# Patient Record
Sex: Male | Born: 1990 | Race: White | Hispanic: No | Marital: Single | Smoking: Never smoker
Health system: Southern US, Community
[De-identification: ages and names within clinical notes are randomized; demographics above are authoritative.]

## PROBLEM LIST (undated history)

## (undated) DIAGNOSIS — K5090 Crohn's disease, unspecified, without complications: Secondary | ICD-10-CM

## (undated) DIAGNOSIS — M92219 Osteochondrosis (juvenile) of carpal lunate [Kienbock], unspecified hand: Secondary | ICD-10-CM

## (undated) HISTORY — PX: WRIST SURGERY: SHX841

## (undated) HISTORY — PX: ABDOMINAL SURGERY: SHX537

---

## 2020-04-18 ENCOUNTER — Emergency Department (HOSPITAL_BASED_OUTPATIENT_CLINIC_OR_DEPARTMENT_OTHER): Payer: Self-pay

## 2020-04-18 ENCOUNTER — Encounter (HOSPITAL_BASED_OUTPATIENT_CLINIC_OR_DEPARTMENT_OTHER): Payer: Self-pay

## 2020-04-18 ENCOUNTER — Other Ambulatory Visit: Payer: Self-pay

## 2020-04-18 ENCOUNTER — Emergency Department (HOSPITAL_BASED_OUTPATIENT_CLINIC_OR_DEPARTMENT_OTHER)
Admission: EM | Admit: 2020-04-18 | Discharge: 2020-04-18 | Disposition: A | Discharge status: Home / Self Care | Payer: Self-pay | Attending: Emergency Medicine | Admitting: Emergency Medicine

## 2020-04-18 DIAGNOSIS — Z8719 Personal history of other diseases of the digestive system: Secondary | ICD-10-CM | POA: Insufficient documentation

## 2020-04-18 DIAGNOSIS — K529 Noninfective gastroenteritis and colitis, unspecified: Secondary | ICD-10-CM | POA: Insufficient documentation

## 2020-04-18 DIAGNOSIS — K5010 Crohn's disease of large intestine without complications: Secondary | ICD-10-CM

## 2020-04-18 HISTORY — DX: Osteochondrosis (juvenile) of carpal lunate (kienbock), unspecified hand: M92.219

## 2020-04-18 HISTORY — DX: Crohn's disease, unspecified, without complications: K50.90

## 2020-04-18 LAB — COMPREHENSIVE METABOLIC PANEL
ALT: 17 U/L (ref 0–44)
AST: 28 U/L (ref 15–41)
Albumin: 4.3 g/dL (ref 3.5–5.0)
Alkaline Phosphatase: 65 U/L (ref 38–126)
Anion gap: 11 (ref 5–15)
BUN: 15 mg/dL (ref 6–20)
CO2: 27 mmol/L (ref 22–32)
Calcium: 8.8 mg/dL — ABNORMAL LOW (ref 8.9–10.3)
Chloride: 103 mmol/L (ref 98–111)
Creatinine, Ser: 1.11 mg/dL (ref 0.61–1.24)
GFR calc Af Amer: >60 mL/min (ref >60)
GFR calc non Af Amer: >60 mL/min (ref >60)
Glucose, Bld: 89 mg/dL (ref 70–99)
Potassium: 3.4 mmol/L — ABNORMAL LOW (ref 3.5–5.1)
Sodium: 141 mmol/L (ref 135–145)
Total Bilirubin: 0.5 mg/dL (ref 0.3–1.2)
Total Protein: 7.2 g/dL (ref 6.5–8.1)

## 2020-04-18 LAB — CBC WITH DIFFERENTIAL/PLATELET
Abs Immature Granulocytes: 0.03 10*3/uL (ref 0.00–0.07)
Basophils Absolute: 0.1 10*3/uL (ref 0.0–0.1)
Basophils Relative: 1 %
Eosinophils Absolute: 0.2 10*3/uL (ref 0.0–0.5)
Eosinophils Relative: 2 %
HCT: 38.2 % — ABNORMAL LOW (ref 39.0–52.0)
Hemoglobin: 12.6 g/dL — ABNORMAL LOW (ref 13.0–17.0)
Immature Granulocytes: 0 %
Lymphocytes Relative: 13 %
Lymphs Abs: 1.2 10*3/uL (ref 0.7–4.0)
MCH: 29.9 pg (ref 26.0–34.0)
MCHC: 33 g/dL (ref 30.0–36.0)
MCV: 90.7 fL (ref 80.0–100.0)
Monocytes Absolute: 0.8 10*3/uL (ref 0.1–1.0)
Monocytes Relative: 8 %
Neutro Abs: 7.4 10*3/uL (ref 1.7–7.7)
Neutrophils Relative %: 76 %
Platelets: 292 10*3/uL (ref 150–400)
RBC: 4.21 MIL/uL — ABNORMAL LOW (ref 4.22–5.81)
RDW: 12.9 % (ref 11.5–15.5)
WBC: 9.7 10*3/uL (ref 4.0–10.5)
nRBC: 0 % (ref 0.0–0.2)

## 2020-04-18 LAB — URINALYSIS, ROUTINE W REFLEX MICROSCOPIC
Bilirubin Urine: NEGATIVE
Glucose, UA: NEGATIVE mg/dL
Hgb urine dipstick: NEGATIVE
Ketones, ur: NEGATIVE mg/dL
Leukocytes,Ua: NEGATIVE
Nitrite: NEGATIVE
Protein, ur: NEGATIVE mg/dL
Specific Gravity, Urine: 1.02 (ref 1.005–1.030)
pH: 7 (ref 5.0–8.0)

## 2020-04-18 LAB — OCCULT BLOOD X 1 CARD TO LAB, STOOL: Fecal Occult Bld: NEGATIVE

## 2020-04-18 LAB — LIPASE, BLOOD: Lipase: 34 U/L (ref 11–51)

## 2020-04-18 MED ORDER — MORPHINE SULFATE (PF) 4 MG/ML IV SOLN
4.0000 mg | Freq: Once | INTRAVENOUS | Status: AC
  Administered 2020-04-18: 4 mg via INTRAVENOUS
  Filled 2020-04-18: qty 1

## 2020-04-18 MED ORDER — PREDNISONE 20 MG PO TABS
40.0000 mg | ORAL_TABLET | Freq: Once | ORAL | Status: AC
  Administered 2020-04-18: 40 mg via ORAL
  Filled 2020-04-18: qty 2

## 2020-04-18 MED ORDER — IOHEXOL 300 MG/ML  SOLN
100.0000 mL | Freq: Once | INTRAMUSCULAR | Status: AC
  Administered 2020-04-18: 100 mL via INTRAVENOUS

## 2020-04-18 MED ORDER — ONDANSETRON HCL 4 MG/2ML IJ SOLN
4.0000 mg | Freq: Once | INTRAMUSCULAR | Status: AC
  Administered 2020-04-18: 4 mg via INTRAVENOUS
  Filled 2020-04-18: qty 2

## 2020-04-18 MED ORDER — PREDNISONE 10 MG PO TABS
ORAL_TABLET | ORAL | 0 refills | Status: DC
Start: 2020-04-18 — End: 2020-08-20

## 2020-04-18 MED ORDER — SODIUM CHLORIDE 0.9 % IV BOLUS
1000.0000 mL | Freq: Once | INTRAVENOUS | Status: AC
  Administered 2020-04-18: 1000 mL via INTRAVENOUS

## 2020-04-18 NOTE — Discharge Instructions (Signed)
You have been diagnosed with colitis from Crohn's disease.  Take steroid as prescribed.  Call and follow-up closely with Dr. Chales Abrahams for further management of your condition in the next 2 to 3 weeks.

## 2020-04-18 NOTE — ED Provider Notes (Signed)
[facility] EMERGENCY DEPARTMENT Provider Note   CSN: [id number] Arrival date & time: 04/18/20  1632     History Chief Complaint  Patient presents with  . Abdominal Pain    Raymond Huerta is a 29 y.o. male.  The history is provided by the patient and medical records. No language interpreter was used.  Abdominal Pain    29 year old male with history of Crohn's disease presenting complaining of abdominal pain.  Patient developed gradual onset of pain to his left lower quadrant that has now become more diffuse since yesterday.  Pain is sharp, persistent, nothing seem to make it better or worse.  Patient report he is afraid of eating thinking it may increase the pain.  Today he also noted some bright red blood per rectum.  Blood is minimal.  He did feel a bit lightheadedness earlier this morning.  He also report having weight loss of approximately 10 to 15 pounds within the past month.  He denies having fever, chills, chest pain, shortness of breath, productive cough, dysuria.  He endorsed some nausea without vomiting.  He is currently on Stelara for his Crohn's disease.  He is not up-to-date with COVID-19 vaccination.  He denies any specific treatment tried for his symptoms aside from Advil and Bentyl.  Past Medical History:  Diagnosis Date  . Crohn disease (HCC)   . Kienbock's disease     There are no problems to display for this patient.   The histories are not reviewed yet. Please review them in the "History" navigator section and refresh this SmartLink.     No family history on file.  Social History   Tobacco Use  . Smoking status: Never Smoker  . Smokeless tobacco: Never Used  Substance Use Topics  . Alcohol use: Never  . Drug use: Never    Home Medications Prior to Admission medications   Medication Sig Start Date End Date Taking? Authorizing Provider  ustekinumab (STELARA) 90 MG/ML SOSY injection Inject into the skin. 03/14/20  Yes [provider]  dicyclomine (BENTYL) 20 MG tablet Take 20 mg by mouth 2 (two) times daily. 12/25/19   [provider]  dronabinol (MARINOL) 10 MG capsule TAKE 1 CAPSULE BY MOUTH TWICE A DAY BEFORE MEALS 03/25/20   [provider]  metoprolol tartrate (LOPRESSOR) 25 MG tablet Take 25 mg by mouth 2 (two) times daily. 04/07/20   [provider]  ondansetron (ZOFRAN-ODT) 4 MG disintegrating tablet Take 4 mg by mouth every 8 (eight) hours as needed. 11/24/19   [provider]  VYVANSE 60 MG capsule Take 60 mg by mouth every morning. 01/29/20   [provider]    Allergies    Patient has no known allergies.  Review of Systems   Review of Systems  Gastrointestinal: Positive for abdominal pain.  All other systems reviewed and are negative.   Physical Exam Updated Vital Signs BP (!) 150/96 (BP Location: Right Arm)   Pulse 76   Temp 98.5 F (36.9 C) (Oral)   Resp 16   Ht 6' (1.829 m)   Wt 68 kg   SpO2 100%   BMI 20.34 kg/m   Physical Exam Vitals and nursing note reviewed. Exam conducted with a chaperone present.  Constitutional:      General: He is not in acute distress.    Appearance: He is well-developed.  HENT:     Head: Atraumatic.  Eyes:     Conjunctiva/sclera: Conjunctivae normal.  Cardiovascular:  Rate and Rhythm: Normal rate and regular rhythm.  Pulmonary:     Effort: Pulmonary effort is normal.     Breath sounds: Normal breath sounds.  Abdominal:     General: Abdomen is flat.     Palpations: Abdomen is soft.     Tenderness: There is abdominal tenderness in the periumbilical area and left lower quadrant. There is guarding. There is no rebound. Negative signs include Murphy's sign, Rovsing's sign and McBurney's sign.  Genitourinary:    Rectum: Normal. No tenderness.     Comments: Chaperone present during exam.  Normal perirectal region without any thrombosed hemorrhoid or anal fissure.  Mild discomfort with digital rectal exam.   No obvious mass, normal prostate, no gross blood noted.  Normal rectal tone Musculoskeletal:     Cervical back: Neck supple.  Skin:    Findings: No rash.  Neurological:     Mental Status: He is alert.     ED Results / Procedures / Treatments   Labs (all labs ordered are listed, but only abnormal results are displayed) Labs Reviewed  CBC WITH DIFFERENTIAL/PLATELET - Abnormal; Notable for the following components:      Result Value   RBC 4.21 (*)    Hemoglobin 12.6 (*)    HCT 38.2 (*)    All other components within normal limits  COMPREHENSIVE METABOLIC PANEL - Abnormal; Notable for the following components:   Potassium 3.4 (*)    Calcium 8.8 (*)    All other components within normal limits  GASTROINTESTINAL PANEL BY PCR, STOOL (REPLACES STOOL CULTURE)  C DIFFICILE QUICK SCREEN W PCR REFLEX  CALPROTECTIN, FECAL  LIPASE, BLOOD  URINALYSIS, ROUTINE W REFLEX MICROSCOPIC  OCCULT BLOOD X 1 CARD TO LAB, STOOL  C-REACTIVE PROTEIN    EKG None  Radiology CT ABDOMEN PELVIS W CONTRAST  Result Date: 04/18/2020 CLINICAL DATA:  Left lower quadrant pain. Bright red rectal bleeding for 1 day. History of Crohn's. EXAM: CT ABDOMEN AND PELVIS WITH CONTRAST TECHNIQUE: Multidetector CT imaging of the abdomen and pelvis was performed using the standard protocol following bolus administration of intravenous contrast. CONTRAST:  150mL OMNIPAQUE IOHEXOL 300 MG/ML  SOLN COMPARISON:  None available. FINDINGS: Lower chest: Lung bases are clear. Hepatobiliary: Focal fatty infiltration adjacent to the falciform ligament. Postcholecystectomy without biliary dilatation. Mild periportal edema which may be related to hydration status. Portal vein is patent. Pancreas: No ductal dilatation or inflammation. Spleen: Normal in size without focal abnormality. Adrenals/Urinary Tract: Normal adrenal glands. No hydronephrosis or perinephric edema. Homogeneous renal enhancement. Urinary bladder is distended without wall  thickening. Stomach/Bowel: The stomach is unremarkable. Administered enteric contrast reaches the mid small bowel. There are few fluid-filled loops of small bowel in the pelvis but no evidence of small bowel inflammation or bowel wall thickening. Surgical clips at the base of the cecum suggestive of prior appendectomy and possible ileocectomy. There is mild colonic wall thickening, intermittently from the distal transverse colon, but more prominently involving the distal descending and proximal sigmoid colon. Mild adjacent pericolonic edema involving the descending and sigmoid colon. No significant hyperemia. No perforation. Vascular/Lymphatic: Normal caliber abdominal aorta. The portal vein is patent. The mesenteric vessels are patent. No acute vascular findings. No enlarged lymph nodes in the abdomen or pelvis. Reproductive: Prostate is unremarkable. Other: No ascites. No free air. No body wall hernia. No intra-abdominal abscess. Musculoskeletal: There are no acute or suspicious osseous abnormalities. IMPRESSION: Mild colonic wall thickening, intermittently from the distal transverse colon, but more prominently involving  the distal descending and proximal sigmoid colon. Mild adjacent pericolonic edema. Findings consistent with colitis, likely related to Crohn's disease. No obstruction, perforation or abscess. Electronically Signed   By: Narda Rutherford M.D.   On: 04/18/2020 18:36    Procedures Procedures (including critical care time)  Medications Ordered in ED Medications  predniSONE (DELTASONE) tablet 40 mg (has no administration in time range)  morphine 4 MG/ML injection 4 mg (4 mg Intravenous Given 04/18/20 1711)  ondansetron (ZOFRAN) injection 4 mg (4 mg Intravenous Given 04/18/20 1711)  sodium chloride 0.9 % bolus 1,000 mL (0 mLs Intravenous Stopped 04/18/20 1922)  iohexol (OMNIPAQUE) 300 MG/ML solution 100 mL (100 mLs Intravenous Contrast Given 04/18/20 1813)  morphine 4 MG/ML injection 4 mg (4 mg  Intravenous Given 04/18/20 1809)  morphine 4 MG/ML injection 4 mg (4 mg Intravenous Given 04/18/20 1917)  ondansetron (ZOFRAN) injection 4 mg (4 mg Intravenous Given 04/18/20 1918)    ED Course  I have reviewed the triage vital signs and the nursing notes.  Pertinent labs & imaging results that were available during my care of the patient were reviewed by me and considered in my medical decision making (see chart for details).    MDM Rules/Calculators/A&P                          BP (!) 150/96 (BP Location: Right Arm)   Pulse 76   Temp 98.5 F (36.9 C) (Oral)   Resp 16   Ht 6' (1.829 m)   Wt 68 kg   SpO2 100%   BMI 20.34 kg/m   Final Clinical Impression(s) / ED Diagnoses Final diagnoses:  None    Rx / DC Orders ED Discharge Orders    None     Patient with history of Crohn's disease currently on Stelara presenting with abdominal pain for the past 2 to 3 days.  His labs are reassuring.  CT scan demonstrates finding consistent with colitis without obstruction perforation or abscess.  9:03 PM Appreciate consultation from on-call GI specialist, Dr. Chales Abrahams, who recommend obtaining a CRP, a GI panel stool collection including C. difficile, as well as a fecal calprotectin.  He also recommend starting patient on a steroid taper dose including 40 mg p.o. x2 weeks, 30 mg p.o. x1 week, 20 mg p.o. x1 week and 10 mg p.o. x1 week.  Patient should continue with Stelara and f/u in office in the next 2-3 weeks.  Pt voice understanding and agrees with plan.      Fayrene Helper, PA-C 04/18/20 2144    Benjiman Core, MD 04/19/20 [phone]

## 2020-04-18 NOTE — ED Triage Notes (Signed)
Pt arrives with c/o bright red rectal bleeding X1 day

## 2020-04-18 NOTE — ED Notes (Signed)
[facility] notified (Tara) - patient ready for transport 

## 2020-04-18 NOTE — ED Notes (Signed)
Pt states was unable to get stool sample

## 2020-04-19 LAB — C-REACTIVE PROTEIN: CRP: 0.5 mg/dL (ref <1.0)

## 2020-04-23 ENCOUNTER — Emergency Department (HOSPITAL_BASED_OUTPATIENT_CLINIC_OR_DEPARTMENT_OTHER): Payer: Self-pay

## 2020-04-23 ENCOUNTER — Encounter (HOSPITAL_BASED_OUTPATIENT_CLINIC_OR_DEPARTMENT_OTHER): Payer: Self-pay

## 2020-04-23 ENCOUNTER — Other Ambulatory Visit: Payer: Self-pay

## 2020-04-23 ENCOUNTER — Emergency Department (HOSPITAL_BASED_OUTPATIENT_CLINIC_OR_DEPARTMENT_OTHER)
Admission: EM | Admit: 2020-04-23 | Discharge: 2020-04-23 | Disposition: A | Discharge status: Home / Self Care | Payer: Self-pay | Attending: Emergency Medicine | Admitting: Emergency Medicine

## 2020-04-23 DIAGNOSIS — K5900 Constipation, unspecified: Secondary | ICD-10-CM | POA: Insufficient documentation

## 2020-04-23 DIAGNOSIS — R112 Nausea with vomiting, unspecified: Secondary | ICD-10-CM | POA: Insufficient documentation

## 2020-04-23 LAB — CBC WITH DIFFERENTIAL/PLATELET
Abs Immature Granulocytes: 0.05 10*3/uL (ref 0.00–0.07)
Basophils Absolute: 0 10*3/uL (ref 0.0–0.1)
Basophils Relative: 0 %
Eosinophils Absolute: 0.1 10*3/uL (ref 0.0–0.5)
Eosinophils Relative: 1 %
HCT: 34.7 % — ABNORMAL LOW (ref 39.0–52.0)
Hemoglobin: 11.4 g/dL — ABNORMAL LOW (ref 13.0–17.0)
Immature Granulocytes: 0 %
Lymphocytes Relative: 15 %
Lymphs Abs: 1.9 10*3/uL (ref 0.7–4.0)
MCH: 29.7 pg (ref 26.0–34.0)
MCHC: 32.9 g/dL (ref 30.0–36.0)
MCV: 90.4 fL (ref 80.0–100.0)
Monocytes Absolute: 1.2 10*3/uL — ABNORMAL HIGH (ref 0.1–1.0)
Monocytes Relative: 10 %
Neutro Abs: 9.4 10*3/uL — ABNORMAL HIGH (ref 1.7–7.7)
Neutrophils Relative %: 74 %
Platelets: 299 10*3/uL (ref 150–400)
RBC: 3.84 MIL/uL — ABNORMAL LOW (ref 4.22–5.81)
RDW: 13 % (ref 11.5–15.5)
WBC: 12.8 10*3/uL — ABNORMAL HIGH (ref 4.0–10.5)
nRBC: 0 % (ref 0.0–0.2)

## 2020-04-23 LAB — COMPREHENSIVE METABOLIC PANEL
ALT: 19 U/L (ref 0–44)
AST: 20 U/L (ref 15–41)
Albumin: 4.1 g/dL (ref 3.5–5.0)
Alkaline Phosphatase: 53 U/L (ref 38–126)
Anion gap: 10 (ref 5–15)
BUN: 9 mg/dL (ref 6–20)
CO2: 25 mmol/L (ref 22–32)
Calcium: 8.4 mg/dL — ABNORMAL LOW (ref 8.9–10.3)
Chloride: 104 mmol/L (ref 98–111)
Creatinine, Ser: 0.99 mg/dL (ref 0.61–1.24)
GFR calc Af Amer: >60 mL/min (ref >60)
GFR calc non Af Amer: >60 mL/min (ref >60)
Glucose, Bld: 97 mg/dL (ref 70–99)
Potassium: 2.9 mmol/L — ABNORMAL LOW (ref 3.5–5.1)
Sodium: 139 mmol/L (ref 135–145)
Total Bilirubin: 0.9 mg/dL (ref 0.3–1.2)
Total Protein: 6.8 g/dL (ref 6.5–8.1)

## 2020-04-23 LAB — LIPASE, BLOOD: Lipase: 24 U/L (ref 11–51)

## 2020-04-23 MED ORDER — IOHEXOL 300 MG/ML  SOLN
100.0000 mL | Freq: Once | INTRAMUSCULAR | Status: AC | PRN
  Administered 2020-04-23: 100 mL via INTRAVENOUS

## 2020-04-23 MED ORDER — ONDANSETRON HCL 4 MG/2ML IJ SOLN
4.0000 mg | Freq: Once | INTRAMUSCULAR | Status: AC
  Administered 2020-04-23: 4 mg via INTRAVENOUS
  Filled 2020-04-23: qty 2

## 2020-04-23 MED ORDER — POTASSIUM CHLORIDE 10 MEQ/100ML IV SOLN
10.0000 meq | INTRAVENOUS | Status: AC
  Administered 2020-04-23 (×2): 10 meq via INTRAVENOUS
  Filled 2020-04-23 (×2): qty 100

## 2020-04-23 MED ORDER — DICYCLOMINE HCL 10 MG/ML IM SOLN
20.0000 mg | Freq: Once | INTRAMUSCULAR | Status: AC
  Administered 2020-04-23: 20 mg via INTRAMUSCULAR
  Filled 2020-04-23: qty 2

## 2020-04-23 MED ORDER — FENTANYL CITRATE (PF) 100 MCG/2ML IJ SOLN
100.0000 ug | Freq: Once | INTRAMUSCULAR | Status: AC
  Administered 2020-04-23: 100 ug via INTRAVENOUS
  Filled 2020-04-23: qty 2

## 2020-04-23 MED ORDER — KETOROLAC TROMETHAMINE 30 MG/ML IJ SOLN
30.0000 mg | Freq: Once | INTRAMUSCULAR | Status: AC
  Administered 2020-04-23: 30 mg via INTRAVENOUS
  Filled 2020-04-23: qty 1

## 2020-04-23 MED ORDER — PROMETHAZINE HCL 25 MG RE SUPP
25.0000 mg | Freq: Four times a day (QID) | RECTAL | 0 refills | Status: AC | PRN
Start: 2020-04-23 — End: ?

## 2020-04-23 MED ORDER — SODIUM CHLORIDE 0.9 % IV BOLUS
1000.0000 mL | Freq: Once | INTRAVENOUS | Status: AC
  Administered 2020-04-23: 1000 mL via INTRAVENOUS

## 2020-04-23 MED ORDER — MORPHINE SULFATE (PF) 4 MG/ML IV SOLN
4.0000 mg | Freq: Once | INTRAVENOUS | Status: AC
  Administered 2020-04-23: 4 mg via INTRAVENOUS
  Filled 2020-04-23: qty 1

## 2020-04-23 NOTE — ED Notes (Signed)
ED Provider at bedside. 

## 2020-04-23 NOTE — Discharge Instructions (Addendum)
Thank you for seeing [place] today!  Continue your steroids as prescribed.  Recommend miralax for your constipation, you may try one cap but if you have continued severe constipation recommend 4 caps in one 32oz bottle of gatorade (or similar.) Follow up with your gastroenterologist (current or GI physician of your choosing.)

## 2020-04-23 NOTE — ED Triage Notes (Signed)
Pt with hx crohns disease past 12 years, hx resection.  Arrives with increased pain and nausea, vomiting.  Taking ibuprofen, zofran bentyl prior to arrival.

## 2020-04-23 NOTE — ED Notes (Signed)
Pt given Oral contrast to drink; quick drink, scan in 1 hour; discussed w/ EDMD

## 2020-04-23 NOTE — ED Provider Notes (Signed)
[facility] EMERGENCY DEPARTMENT Provider Note   CSN: [id number] Arrival date & time: 04/23/20  9528     History Chief Complaint  Patient presents with  . Abdominal Pain    Raymond Huerta is a 29 y.o. male.  HPI     Pain worsened again last night and today Nausea and vomiting, vomited twice today, no blood in emesis No black or bloody stools Constipation. Not passing gas. Well maybe a little bit very early this AM but none since then. No fevers. No urinary symptoms.   Pain periumbilical, R and LLQ, nothing makes it better or worse, tried old bentyl, tylenol, zofran, some ibuprofen but trying to avoid it. 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 Crohn disease (HCC)   . Kienbock's disease     There are no problems to display for this patient.   Past Surgical History:  Procedure Laterality Date  . ABDOMINAL SURGERY         History reviewed. No pertinent family history.  Social History   Tobacco Use  . Smoking status: Never Smoker  . Smokeless tobacco: Never Used  Substance Use Topics  . Alcohol use: Never  . Drug use: Never    Home Medications Prior to Admission medications   Medication Sig Start Date End Date Taking? Authorizing Provider  dicyclomine (BENTYL) 20 MG tablet Take 20 mg by mouth 2 (two) times daily. 12/25/19   [provider]  dronabinol (MARINOL) 10 MG capsule TAKE 1 CAPSULE BY MOUTH TWICE A DAY BEFORE MEALS 03/25/20   [provider]  metoprolol tartrate (LOPRESSOR) 25 MG tablet Take 25 mg by mouth 2 (two) times daily. 04/07/20   [provider]  ondansetron (ZOFRAN-ODT) 4 MG disintegrating tablet Take 4 mg by mouth every 8 (eight) hours as needed. 11/24/19   [provider]  predniSONE (DELTASONE) 10 MG tablet Take 40mg  PO daily x 2 weeks, 30mg  PO daily x 1  week, 20mg  PO daily x 1 week, and 10mg  PO daily x 1 week 04/18/20   , PA-C  promethazine (PHENERGAN) 25 MG suppository Place 1 suppository (25 mg total) rectally every 6 (six) hours as needed for nausea or vomiting. 04/23/20   , MD  ustekinumab (STELARA) 90 MG/ML SOSY injection Inject into the skin. 03/14/20   [provider]  VYVANSE 60 MG capsule Take 60 mg by mouth every morning. 01/29/20   [provider]    Allergies    Patient has no known allergies.  Review of Systems   Review of Systems  Constitutional: Negative for fever.  HENT: Negative for sore throat.   Eyes: Negative for visual disturbance.  Respiratory: Negative for shortness of breath.   Cardiovascular: Negative for chest pain.  Gastrointestinal: Positive for abdominal pain, constipation, nausea and vomiting.  Genitourinary: Negative for difficulty urinating.  Musculoskeletal: Negative for back pain and neck stiffness.  Skin: Negative for rash.  Neurological: Negative for syncope and headaches.    Physical Exam Updated Vital Signs BP 135/90   Pulse 64   Temp 98.4 F (36.9 C) (Oral)   Resp 20   SpO2 100%   Physical Exam Vitals and nursing note reviewed.  Constitutional:      General: He is not in acute distress.    Appearance: He is well-developed. He is not  diaphoretic.  HENT:     Head: Normocephalic and atraumatic.  Eyes:     Conjunctiva/sclera: Conjunctivae normal.  Cardiovascular:     Rate and Rhythm: Normal rate and regular rhythm.     Heart sounds: Normal heart sounds. No murmur heard.  No friction rub. No gallop.   Pulmonary:     Effort: Pulmonary effort is normal. No respiratory distress.     Breath sounds: Normal breath sounds. No wheezing or rales.  Abdominal:     General: There is no distension.     Palpations: Abdomen is soft.     Tenderness: There is generalized abdominal tenderness. There is no guarding.  Musculoskeletal:     Cervical back:  Normal range of motion.  Skin:    General: Skin is warm and dry.  Neurological:     Mental Status: He is alert and oriented to person, place, and time.     ED Results / Procedures / Treatments   Labs (all labs ordered are listed, but only abnormal results are displayed) Labs Reviewed  CBC WITH DIFFERENTIAL/PLATELET - Abnormal; Notable for the following components:      Result Value   WBC 12.8 (*)    RBC 3.84 (*)    Hemoglobin 11.4 (*)    HCT 34.7 (*)    Neutro Abs 9.4 (*)    Monocytes Absolute 1.2 (*)    All other components within normal limits  COMPREHENSIVE METABOLIC PANEL - Abnormal; Notable for the following components:   Potassium 2.9 (*)    Calcium 8.4 (*)    All other components within normal limits  LIPASE, BLOOD    EKG None  Radiology CT ABDOMEN PELVIS W CONTRAST  Result Date: 04/23/2020 CLINICAL DATA:  Abdominal pain, vomiting, diarrhea. History of Crohn's disease. EXAM: CT ABDOMEN AND PELVIS WITH CONTRAST TECHNIQUE: Multidetector CT imaging of the abdomen and pelvis was performed using the standard protocol following bolus administration of intravenous contrast. CONTRAST:  OMNIPAQUE IOHEXOL 300 MG/ML  SOLN COMPARISON:  04/18/2020 FINDINGS: Lower chest: No acute abnormality. Hepatobiliary: Previously seen periportal edema has improved. No focal liver abnormality is seen. Status post cholecystectomy. No biliary dilatation. Pancreas: Unremarkable. No pancreatic ductal dilatation or surrounding inflammatory changes. Spleen: Normal in size without focal abnormality. Adrenals/Urinary Tract: Unremarkable adrenal glands. Kidneys enhance symmetrically without focal lesion, stone, or hydronephrosis. Ureters are nondilated. Urinary bladder is largely decompressed but appears grossly unremarkable. Stomach/Bowel: Stomach is unremarkable. There is enteric contrast within the proximal to mid small bowel. Postsurgical changes in the right lower quadrant likely reflecting  appendectomy with probable ileocecectomy. No focal colonic thickening, improved from prior. Mild-to-moderate volume of stool throughout the colon. No evidence of small-bowel thickening. No bowel obstruction. Vascular/Lymphatic: No significant vascular findings are present. No enlarged abdominal or pelvic lymph nodes. Reproductive: Prostate is unremarkable. Other: No free air. No free fluid. No abdominopelvic fluid collection or abscess. No abdominal wall hernia. Musculoskeletal: No acute or significant osseous findings. IMPRESSION: 1. No acute abdominopelvic findings. 2. Interval resolution of previously seen colonic wall thickening. 3. Mild-to-moderate volume of stool throughout the colon. Electronically Signed   By: Duanne Guess D.O.   On: 04/23/2020 12:16    Procedures Procedures (including critical care time)  Medications Ordered in ED Medications  sodium chloride 0.9 % bolus 1,000 mL (0 mLs Intravenous Stopped 04/23/20 1149)  ondansetron (ZOFRAN) injection 4 mg (4 mg Intravenous Given 04/23/20 0933)  morphine 4 MG/ML injection 4 mg (4 mg Intravenous Given 04/23/20 0932)  dicyclomine (  BENTYL) injection 20 mg (20 mg Intramuscular Given 04/23/20 1023)  potassium chloride 10 mEq in 100 mL IVPB (0 mEq Intravenous Stopped 04/23/20 1253)  fentaNYL (SUBLIMAZE) injection 100 mcg (100 mcg Intravenous Given 04/23/20 1018)  iohexol (OMNIPAQUE) 300 MG/ML solution 100 mL (100 mLs Intravenous Contrast Given 04/23/20 1124)  ondansetron (ZOFRAN) injection 4 mg (4 mg Intravenous Given 04/23/20 1315)  ketorolac (TORADOL) 30 MG/ML injection 30 mg (30 mg Intravenous Given 04/23/20 1315)    ED Course  I have reviewed the triage vital signs and the nursing notes.  Pertinent labs & imaging results that were available during my care of the patient were reviewed by me and considered in my medical decision making (see chart for details).    MDM Rules/Calculators/A&P                          29yo male with history  of Crohn's disease with recent visit 6/14 with finding of colitis started on steroid taper, had also been seen 6/8 in two different emergency departments who presents with concern for worsening abdominal pain, nausea and vomiting and constipation.  Describing symptoms of worsening pain, inability to tolerate po, no flatus, and although he had CT 5 days ago, will repeat to look for signs of obstruction.  Labs without signs of pancreatitis or hepatitis. CT shows no sign of obstruction and resolution of previously seen colonic wall thickening with moderate volume stool.  Suspect symptoms may be secondary to constipation.  Recommend miralax. Given rx for phenergan suppositories for nausea. Patient discharged in stable condition with understanding of reasons to return.   Final Clinical Impression(s) / ED Diagnoses Final diagnoses:  Non-intractable vomiting with nausea, unspecified vomiting type  Constipation, unspecified constipation type    Rx / DC Orders ED Discharge Orders         Ordered    promethazine (PHENERGAN) 25 MG suppository  Every 6 hours PRN     Discontinue  Reprint     04/23/20 1302           Gareth Morgan, MD 04/23/20 1841

## 2020-08-20 ENCOUNTER — Emergency Department (HOSPITAL_BASED_OUTPATIENT_CLINIC_OR_DEPARTMENT_OTHER)
Admission: EM | Admit: 2020-08-20 | Discharge: 2020-08-20 | Disposition: A | Discharge status: Home / Self Care | Payer: Self-pay | Attending: Emergency Medicine | Admitting: Emergency Medicine

## 2020-08-20 ENCOUNTER — Other Ambulatory Visit: Payer: Self-pay

## 2020-08-20 ENCOUNTER — Emergency Department (HOSPITAL_BASED_OUTPATIENT_CLINIC_OR_DEPARTMENT_OTHER): Payer: Self-pay

## 2020-08-20 ENCOUNTER — Encounter (HOSPITAL_BASED_OUTPATIENT_CLINIC_OR_DEPARTMENT_OTHER): Payer: Self-pay | Admitting: Emergency Medicine

## 2020-08-20 DIAGNOSIS — Z9889 Other specified postprocedural states: Secondary | ICD-10-CM | POA: Insufficient documentation

## 2020-08-20 DIAGNOSIS — K5090 Crohn's disease, unspecified, without complications: Secondary | ICD-10-CM | POA: Insufficient documentation

## 2020-08-20 DIAGNOSIS — K529 Noninfective gastroenteritis and colitis, unspecified: Secondary | ICD-10-CM | POA: Insufficient documentation

## 2020-08-20 LAB — HEPATIC FUNCTION PANEL
ALT: 15 U/L (ref 0–44)
AST: 23 U/L (ref 15–41)
Albumin: 4.5 g/dL (ref 3.5–5.0)
Alkaline Phosphatase: 79 U/L (ref 38–126)
Bilirubin, Direct: 0.2 mg/dL (ref 0.0–0.2)
Indirect Bilirubin: 0.8 mg/dL (ref 0.3–0.9)
Total Bilirubin: 1 mg/dL (ref 0.3–1.2)
Total Protein: 7.9 g/dL (ref 6.5–8.1)

## 2020-08-20 LAB — CBC WITH DIFFERENTIAL/PLATELET
Abs Immature Granulocytes: 0.02 10*3/uL (ref 0.00–0.07)
Basophils Absolute: 0.1 10*3/uL (ref 0.0–0.1)
Basophils Relative: 2 %
Eosinophils Absolute: 0.1 10*3/uL (ref 0.0–0.5)
Eosinophils Relative: 1 %
HCT: 41.3 % (ref 39.0–52.0)
Hemoglobin: 12.9 g/dL — ABNORMAL LOW (ref 13.0–17.0)
Immature Granulocytes: 0 %
Lymphocytes Relative: 18 %
Lymphs Abs: 1.2 10*3/uL (ref 0.7–4.0)
MCH: 25.6 pg — ABNORMAL LOW (ref 26.0–34.0)
MCHC: 31.2 g/dL (ref 30.0–36.0)
MCV: 82.1 fL (ref 80.0–100.0)
Monocytes Absolute: 0.8 10*3/uL (ref 0.1–1.0)
Monocytes Relative: 11 %
Neutro Abs: 4.7 10*3/uL (ref 1.7–7.7)
Neutrophils Relative %: 68 %
Platelets: 315 10*3/uL (ref 150–400)
RBC: 5.03 MIL/uL (ref 4.22–5.81)
RDW: 15.4 % (ref 11.5–15.5)
WBC: 6.8 10*3/uL (ref 4.0–10.5)
nRBC: 0 % (ref 0.0–0.2)

## 2020-08-20 LAB — BASIC METABOLIC PANEL
Anion gap: 9 (ref 5–15)
BUN: 21 mg/dL — ABNORMAL HIGH (ref 6–20)
CO2: 26 mmol/L (ref 22–32)
Calcium: 9.1 mg/dL (ref 8.9–10.3)
Chloride: 102 mmol/L (ref 98–111)
Creatinine, Ser: 1.12 mg/dL (ref 0.61–1.24)
GFR, Estimated: >60 mL/min (ref >60)
Glucose, Bld: 89 mg/dL (ref 70–99)
Potassium: 3 mmol/L — ABNORMAL LOW (ref 3.5–5.1)
Sodium: 137 mmol/L (ref 135–145)

## 2020-08-20 LAB — LIPASE, BLOOD: Lipase: 34 U/L (ref 11–51)

## 2020-08-20 MED ORDER — PREDNISONE 20 MG PO TABS
40.0000 mg | ORAL_TABLET | Freq: Every day | ORAL | 0 refills | Status: AC
Start: 2020-08-20 — End: 2020-08-27

## 2020-08-20 MED ORDER — SODIUM CHLORIDE 0.9 % IV BOLUS
1000.0000 mL | Freq: Once | INTRAVENOUS | Status: AC
  Administered 2020-08-20: 1000 mL via INTRAVENOUS

## 2020-08-20 MED ORDER — IOHEXOL 300 MG/ML  SOLN
100.0000 mL | Freq: Once | INTRAMUSCULAR | Status: AC | PRN
  Administered 2020-08-20: 100 mL via INTRAVENOUS

## 2020-08-20 MED ORDER — MORPHINE SULFATE (PF) 4 MG/ML IV SOLN
4.0000 mg | Freq: Once | INTRAVENOUS | Status: AC
  Administered 2020-08-20: 4 mg via INTRAVENOUS
  Filled 2020-08-20: qty 1

## 2020-08-20 MED ORDER — ONDANSETRON HCL 4 MG/2ML IJ SOLN
4.0000 mg | Freq: Once | INTRAMUSCULAR | Status: AC
  Administered 2020-08-20: 4 mg via INTRAVENOUS
  Filled 2020-08-20: qty 2

## 2020-08-20 NOTE — ED Notes (Signed)
ED Provider at bedside. 

## 2020-08-20 NOTE — ED Provider Notes (Signed)
[facility] EMERGENCY DEPARTMENT Provider Note   CSN: [id number] Arrival date & time: 08/20/20  1043     History Chief Complaint  Patient presents with  . Abdominal Pain    Raymond Huerta is a 29 y.o. male.  The history is provided by the patient.  Abdominal Pain Pain location:  Generalized Pain quality: aching   Pain radiates to:  Does not radiate Pain severity:  Mild Onset quality:  Gradual Timing:  Constant Progression:  Worsening Chronicity:  New Context comment:  Crohns with worsening symptoms, seen at another ED yesterday but feel okay not to get a CT, pain is getting worse and wants scan now. Relieved by:  Nothing Worsened by:  Nothing Associated symptoms: anorexia, diarrhea and nausea   Associated symptoms: no chest pain, no chills, no constipation, no cough, no dysuria, no fever, no hematuria, no shortness of breath, no sore throat and no vomiting   Risk factors: multiple surgeries        Past Medical History:  Diagnosis Date  . Crohn disease (HCC)   . Kienbock's disease     There are no problems to display for this patient.   Past Surgical History:  Procedure Laterality Date  . ABDOMINAL SURGERY    . WRIST SURGERY         No family history on file.  Social History   Tobacco Use  . Smoking status: Never Smoker  . Smokeless tobacco: Never Used  Substance Use Topics  . Alcohol use: Never  . Drug use: Never    Home Medications Prior to Admission medications   Medication Sig Start Date End Date Taking? Authorizing Provider  dicyclomine (BENTYL) 20 MG tablet Take 20 mg by mouth 2 (two) times daily. 12/25/19   [provider]  dronabinol (MARINOL) 10 MG capsule TAKE 1 CAPSULE BY MOUTH TWICE A DAY BEFORE MEALS 03/25/20   [provider]  metoprolol tartrate (LOPRESSOR) 25 MG tablet Take 25 mg by mouth 2 (two) times daily. 04/07/20   [provider]  ondansetron (ZOFRAN-ODT) 4 MG disintegrating tablet Take 4  mg by mouth every 8 (eight) hours as needed. 11/24/19   [provider]  predniSONE (DELTASONE) 20 MG tablet Take 2 tablets (40 mg total) by mouth daily for 7 days. 08/20/20 08/27/20  Manny Vitolo, DO  promethazine (PHENERGAN) 25 MG suppository Place 1 suppository (25 mg total) rectally every 6 (six) hours as needed for nausea or vomiting. 04/23/20   Alvira Monday, MD  ustekinumab (STELARA) 90 MG/ML SOSY injection Inject into the skin. 03/14/20   [provider]  VYVANSE 60 MG capsule Take 60 mg by mouth every morning. 01/29/20   [provider]    Allergies    Patient has no known allergies.  Review of Systems   Review of Systems  Constitutional: Negative for chills and fever.  HENT: Negative for ear pain and sore throat.   Eyes: Negative for pain and visual disturbance.  Respiratory: Negative for cough and shortness of breath.   Cardiovascular: Negative for chest pain and palpitations.  Gastrointestinal: Positive for abdominal pain, anorexia, diarrhea and nausea. Negative for constipation and vomiting.  Genitourinary: Negative for dysuria and hematuria.  Musculoskeletal: Negative for arthralgias and back pain.  Skin: Negative for color change and rash.  Neurological: Negative for seizures and syncope.  All other systems reviewed and are negative.   Physical Exam Updated Vital Signs BP (!) 163/96 (BP Location: Right Arm)   Pulse 99  Temp 98.2 F (36.8 C) (Oral)   Resp 16   Ht 5\' 11"  (1.803 m)   Wt 62.9 kg   SpO2 100%   BMI 19.33 kg/m   Physical Exam Vitals and nursing note reviewed.  Constitutional:      General: He is not in acute distress.    Appearance: He is well-developed. He is not ill-appearing.  HENT:     Head: Normocephalic and atraumatic.     Mouth/Throat:     Mouth: Mucous membranes are moist.  Eyes:     Extraocular Movements: Extraocular movements intact.     Conjunctiva/sclera: Conjunctivae normal.     Pupils: Pupils are  equal, round, and reactive to light.  Cardiovascular:     Rate and Rhythm: Normal rate and regular rhythm.     Heart sounds: Normal heart sounds. No murmur heard.   Pulmonary:     Effort: Pulmonary effort is normal. No respiratory distress.     Breath sounds: Normal breath sounds.  Abdominal:     General: Abdomen is flat.     Palpations: Abdomen is soft.     Tenderness: There is generalized abdominal tenderness. There is guarding.  Musculoskeletal:     Cervical back: Neck supple.  Skin:    General: Skin is warm and dry.     Capillary Refill: Capillary refill takes less than 2 seconds.  Neurological:     General: No focal deficit present.     Mental Status: He is alert.  Psychiatric:        Mood and Affect: Mood normal.     ED Results / Procedures / Treatments   Labs (all labs ordered are listed, but only abnormal results are displayed) Labs Reviewed  CBC WITH DIFFERENTIAL/PLATELET - Abnormal; Notable for the following components:      Result Value   Hemoglobin 12.9 (*)    MCH 25.6 (*)    All other components within normal limits  BASIC METABOLIC PANEL - Abnormal; Notable for the following components:   Potassium 3.0 (*)    BUN 21 (*)    All other components within normal limits  HEPATIC FUNCTION PANEL  LIPASE, BLOOD    EKG None  Radiology CT ABDOMEN PELVIS W CONTRAST  Result Date: 08/20/2020 CLINICAL DATA:  History of Crohn's disease with previous hemicolectomy, now with abdominal distention. EXAM: CT ABDOMEN AND PELVIS WITH CONTRAST TECHNIQUE: Multidetector CT imaging of the abdomen and pelvis was performed using the standard protocol following bolus administration of intravenous contrast. CONTRAST:  08/22/2020 OMNIPAQUE IOHEXOL 300 MG/ML  SOLN COMPARISON:  05/03/2020; 04/18/2020 FINDINGS: Lower chest: Limited visualization of the lower thorax is negative for focal airspace opacity or pleural effusion. Hepatobiliary: Normal hepatic contour. There is a minimal amount of  focal fatty infiltration adjacent to the fissure for ligamentum teres. No discrete hepatic lesions. Post cholecystectomy with similar appearing mild dilatation of the intrahepatic biliary tree, likely the sequela of biliary reservoir phenomena. No extrahepatic bili duct dilatation. No ascites. Pancreas: Normal appearance of the pancreas. Spleen: Normal appearance of the spleen. Adrenals/Urinary Tract: There is symmetric enhancement of the bilateral kidneys. No evidence of nephrolithiasis. No urine obstruction or perinephric stranding. Normal appearance the bilateral adrenal glands. Normal appearance of the urinary bladder given degree of distention. Stomach/Bowel: Apparent circumferential wall thickening involving the descending and sigmoid colon (representative images 25, 39, 45, 61 and 64, series 5), potentially accentuated due to underdistention though conceivably an enteritis could have a similar appearance. No additional areas of discrete  bowel wall thickening are identified. No evidence of perforation or definable/drainable fluid collection. No definitive evidence enteric fistula. Stable sequela of distal ileal resection without evidence of enteric obstruction. No additional discrete areas of bowel wall thickening are identified. Post appendectomy. No pneumoperitoneum, pneumatosis or portal venous gas. Vascular/Lymphatic: Normal caliber the abdominal aorta. The major branch vessels of the abdominal aorta appear patent. No bulky retroperitoneal, mesenteric, pelvic or inguinal lymphadenopathy. Reproductive: Normal appearance the prostate gland. No free fluid the pelvic cul-de-sac. Other: Regional soft tissues appear normal. Musculoskeletal: No acute or aggressive osseous abnormalities. IMPRESSION: 1. Apparent circumferential wall thickening involving the descending and sigmoid colon, potentially accentuated due to underdistention though conceivably an enteritis (such as Crohn's colitis given clinical history)  could have a similar appearance. No evidence of perforation or definable/drainable fluid collection. 2. Stable sequela of distal ileal resection without evidence of enteric obstruction. 3. Post cholecystectomy and appendectomy. Electronically Signed   By: Simonne Come M.D.   On: 08/20/2020 11:51    Procedures Procedures (including critical care time)  Medications Ordered in ED Medications  morphine 4 MG/ML injection 4 mg (has no administration in time range)  morphine 4 MG/ML injection 4 mg (4 mg Intravenous Given 08/20/20 1122)  ondansetron (ZOFRAN) injection 4 mg (4 mg Intravenous Given 08/20/20 1121)  sodium chloride 0.9 % bolus 1,000 mL (1,000 mLs Intravenous New Bag/Given 08/20/20 1125)  iohexol (OMNIPAQUE) 300 MG/ML solution 100 mL (100 mLs Intravenous Contrast Given 08/20/20 1135)    ED Course  I have reviewed the triage vital signs and the nursing notes.  Pertinent labs & imaging results that were available during my care of the patient were reviewed by me and considered in my medical decision making (see chart for details).    MDM Rules/Calculators/A&P                          Raymond Huerta is a 29 year old male with history of Crohn's who presents the ED with abdominal pain.  Unremarkable vitals.  No fever.  Worsening abdominal pain over the last several days.  Seen yesterday at a hospital but felt better after pain medications and declined a CT scan yesterday.  However pain getting worse and thinks that he needs one.  I agree as he does have tenderness throughout but worse in the right lower quadrant.  States he had his appendix removed in the past.  Suspect Crohn's flare but will rule out other infectious or acute intra-abdominal process with CT scan.  Will give IV fluids, IV nausea meds, IV morphine.  CT scan likely consistent with a Crohn's colitis.  However lab work otherwise unremarkable.  Felt better after IV pain medicine.  Will prescribe prednisone.  We will have him  follow-up with GI and his primary care doctor.  This chart was dictated using voice recognition software.  Despite best efforts to proofread,  errors can occur which can change the documentation meaning.   Final Clinical Impression(s) / ED Diagnoses Final diagnoses:  Colitis    Rx / DC Orders ED Discharge Orders         Ordered    predniSONE (DELTASONE) 20 MG tablet  Daily        08/20/20 1206           Virgina Norfolk, DO 08/20/20 1207

## 2020-08-20 NOTE — ED Triage Notes (Signed)
Episode of Crohn's fare up that started last night. Abdominal pain with some diarrhea and blood in his stool.

## 2020-08-20 NOTE — ED Notes (Signed)
Patient transported to CT 

## 2020-08-20 NOTE — Discharge Instructions (Addendum)
Follow-up with your primary care doctor or GI doctor to continue to discuss further treatment plan.  I have written for steroids for a week but they may want to extend the course.

## 2020-09-01 ENCOUNTER — Emergency Department (HOSPITAL_BASED_OUTPATIENT_CLINIC_OR_DEPARTMENT_OTHER)
Admission: EM | Admit: 2020-09-01 | Discharge: 2020-09-01 | Disposition: A | Discharge status: Home / Self Care | Payer: Self-pay | Attending: Emergency Medicine | Admitting: Emergency Medicine

## 2020-09-01 ENCOUNTER — Encounter (HOSPITAL_BASED_OUTPATIENT_CLINIC_OR_DEPARTMENT_OTHER): Payer: Self-pay | Admitting: *Deleted

## 2020-09-01 ENCOUNTER — Other Ambulatory Visit: Payer: Self-pay

## 2020-09-01 DIAGNOSIS — K50111 Crohn's disease of large intestine with rectal bleeding: Secondary | ICD-10-CM | POA: Insufficient documentation

## 2020-09-01 LAB — CBC WITH DIFFERENTIAL/PLATELET
Abs Immature Granulocytes: 0.02 10*3/uL (ref 0.00–0.07)
Basophils Absolute: 0.1 10*3/uL (ref 0.0–0.1)
Basophils Relative: 1 %
Eosinophils Absolute: 0.2 10*3/uL (ref 0.0–0.5)
Eosinophils Relative: 3 %
HCT: 38.4 % — ABNORMAL LOW (ref 39.0–52.0)
Hemoglobin: 12.2 g/dL — ABNORMAL LOW (ref 13.0–17.0)
Immature Granulocytes: 0 %
Lymphocytes Relative: 21 %
Lymphs Abs: 1.2 10*3/uL (ref 0.7–4.0)
MCH: 26.3 pg (ref 26.0–34.0)
MCHC: 31.8 g/dL (ref 30.0–36.0)
MCV: 82.8 fL (ref 80.0–100.0)
Monocytes Absolute: 0.8 10*3/uL (ref 0.1–1.0)
Monocytes Relative: 12 %
Neutro Abs: 3.8 10*3/uL (ref 1.7–7.7)
Neutrophils Relative %: 63 %
Platelets: 234 10*3/uL (ref 150–400)
RBC: 4.64 MIL/uL (ref 4.22–5.81)
RDW: 16.1 % — ABNORMAL HIGH (ref 11.5–15.5)
WBC: 6.1 10*3/uL (ref 4.0–10.5)
nRBC: 0 % (ref 0.0–0.2)

## 2020-09-01 LAB — COMPREHENSIVE METABOLIC PANEL
ALT: 14 U/L (ref 0–44)
AST: 22 U/L (ref 15–41)
Albumin: 4.1 g/dL (ref 3.5–5.0)
Alkaline Phosphatase: 73 U/L (ref 38–126)
Anion gap: 12 (ref 5–15)
BUN: 12 mg/dL (ref 6–20)
CO2: 25 mmol/L (ref 22–32)
Calcium: 8.6 mg/dL — ABNORMAL LOW (ref 8.9–10.3)
Chloride: 104 mmol/L (ref 98–111)
Creatinine, Ser: 1.16 mg/dL (ref 0.61–1.24)
GFR, Estimated: >60 mL/min (ref >60)
Glucose, Bld: 99 mg/dL (ref 70–99)
Potassium: 3.2 mmol/L — ABNORMAL LOW (ref 3.5–5.1)
Sodium: 141 mmol/L (ref 135–145)
Total Bilirubin: 0.8 mg/dL (ref 0.3–1.2)
Total Protein: 6.8 g/dL (ref 6.5–8.1)

## 2020-09-01 LAB — URINALYSIS, ROUTINE W REFLEX MICROSCOPIC
Bilirubin Urine: NEGATIVE
Glucose, UA: NEGATIVE mg/dL
Hgb urine dipstick: NEGATIVE
Ketones, ur: NEGATIVE mg/dL
Leukocytes,Ua: NEGATIVE
Nitrite: NEGATIVE
Protein, ur: NEGATIVE mg/dL
Specific Gravity, Urine: 1.01 (ref 1.005–1.030)
pH: 6.5 (ref 5.0–8.0)

## 2020-09-01 LAB — LIPASE, BLOOD: Lipase: 50 U/L (ref 11–51)

## 2020-09-01 MED ORDER — MORPHINE SULFATE 15 MG PO TABS: 7.5000 mg | ORAL_TABLET | ORAL | 0 refills | Status: DC | PRN

## 2020-09-01 MED ORDER — PREDNISONE 10 MG PO TABS
ORAL_TABLET | ORAL | 0 refills | Status: DC
Start: 2020-09-01 — End: 2020-09-16

## 2020-09-01 MED ORDER — ONDANSETRON HCL 4 MG/2ML IJ SOLN
4.0000 mg | Freq: Once | INTRAMUSCULAR | Status: AC
  Administered 2020-09-01: 4 mg via INTRAVENOUS
  Filled 2020-09-01: qty 2

## 2020-09-01 MED ORDER — SODIUM CHLORIDE 0.9 % IV BOLUS
1000.0000 mL | Freq: Once | INTRAVENOUS | Status: AC
  Administered 2020-09-01: 1000 mL via INTRAVENOUS

## 2020-09-01 MED ORDER — MORPHINE SULFATE (PF) 4 MG/ML IV SOLN
8.0000 mg | Freq: Once | INTRAVENOUS | Status: AC
  Administered 2020-09-01: 8 mg via INTRAVENOUS
  Filled 2020-09-01: qty 2

## 2020-09-01 MED ORDER — PREDNISONE 20 MG PO TABS
40.0000 mg | ORAL_TABLET | Freq: Once | ORAL | Status: AC
  Administered 2020-09-01: 40 mg via ORAL
  Filled 2020-09-01: qty 2

## 2020-09-01 MED ORDER — ONDANSETRON 4 MG PO TBDP
ORAL_TABLET | ORAL | 0 refills | Status: DC
Start: 2020-09-01 — End: 2020-09-30

## 2020-09-01 NOTE — Progress Notes (Signed)
..  Transition of Care [facility]) - Emergency Department Mini Assessment   Patient Details  Name: Raymond Huerta MRN: [id number] Date of Birth: 08/07/1991  Transition of Care [facility]) CM/SW Contact:    Elliot Cousin, RN Phone Number:  [phone] 09/01/2020, 3:20 PM   Clinical Narrative: TOC CM attempted call to pt and unable to leave message. Will arrange follow up appt with [facility] once CM speaks to pt to verify he wants to travel to [facility] for appt.    ED Mini Assessment: What brought you to the Emergency Department? : pain  Barriers to Discharge: No Barriers Identified     Means of departure: Car  Interventions which prevented an admission or readmission: Medication Review, Follow-up medical appointment    Patient Contact and Communications        ,                 Admission diagnosis:  abdominal pain and rectal bleeding There are no problems to display for this patient.  PCP:  Pcp, No Pharmacy:   [facility] [address] [place], [place] - [address] AT [facility] [address] [place] [address] Phone: [phone] Fax: [phone]  [id number] - [place], [place] - [address] AT [facility] OF [address] [place] [address] Phone: [phone] Fax: [phone]

## 2020-09-01 NOTE — ED Triage Notes (Signed)
Hx of crohn's. Abdominal pain with blood in his stool this am.

## 2020-09-01 NOTE — ED Provider Notes (Signed)
[facility] EMERGENCY DEPARTMENT Provider Note   CSN: [id number] Arrival date & time: 09/01/20  1313     History Chief Complaint  Patient presents with  . Abdominal Pain  . Rectal Bleeding    Raymond Huerta is a 29 y.o. male.  29 yo M with a chief complaints of periumbilical abdominal pain and bright red blood per rectum.  The patient unfortunately has a history of Crohn's disease and has lost his insurance.  Has not been able to follow-up with a gastroenterologist in the office.  He hopes to get his insurance in the next month or so and then will try and reestablish.  He does not feel any pain in the inside of the ordinary for his flares.  He denies fevers.  The history is provided by the patient.  Abdominal Pain Pain location:  Generalized Pain quality: aching and sharp   Pain radiates to:  Does not radiate Pain severity:  Moderate Onset quality:  Gradual Duration:  2 days Timing:  Constant Progression:  Worsening Chronicity:  New Relieved by:  Nothing Worsened by:  Nothing Ineffective treatments:  None tried Associated symptoms: diarrhea and hematochezia   Associated symptoms: no chest pain, no chills, no fever, no shortness of breath and no vomiting   Rectal Bleeding Associated symptoms: abdominal pain   Associated symptoms: no fever and no vomiting        Past Medical History:  Diagnosis Date  . Crohn disease (HCC)   . Kienbock's disease     There are no problems to display for this patient.   Past Surgical History:  Procedure Laterality Date  . ABDOMINAL SURGERY    . WRIST SURGERY         No family history on file.  Social History   Tobacco Use  . Smoking status: Never Smoker  . Smokeless tobacco: Never Used  Substance Use Topics  . Alcohol use: Never  . Drug use: Never    Home Medications Prior to Admission medications   Medication Sig Start Date End Date Taking? Authorizing Provider  dicyclomine (BENTYL) 20 MG tablet Take  20 mg by mouth 2 (two) times daily. 12/25/19   [provider]  dronabinol (MARINOL) 10 MG capsule TAKE 1 CAPSULE BY MOUTH TWICE A DAY BEFORE MEALS 03/25/20   [provider]  metoprolol tartrate (LOPRESSOR) 25 MG tablet Take 25 mg by mouth 2 (two) times daily. 04/07/20   [provider]  morphine (MSIR) 15 MG tablet Take 0.5 tablets (7.5 mg total) by mouth every 4 (four) hours as needed for severe pain. 09/01/20   Melene Plan, DO  ondansetron (ZOFRAN ODT) 4 MG disintegrating tablet 4mg  ODT q4 hours prn nausea/vomit 09/01/20   09/03/20, DO  predniSONE (DELTASONE) 10 MG tablet Take 40mg (4 tabs) PO x5 days Take 30mg (3tabs) PO x5 days Take 20mg (2tabs)PO x5 days Take 10mg (1tab)PO x 5 days 09/01/20   , DO  promethazine (PHENERGAN) 25 MG suppository Place 1 suppository (25 mg total) rectally every 6 (six) hours as needed for nausea or vomiting. 04/23/20   , MD  ustekinumab (STELARA) 90 MG/ML SOSY injection Inject into the skin. 03/14/20   [provider]  VYVANSE 60 MG capsule Take 60 mg by mouth every morning. 01/29/20   [provider]    Allergies    Patient has no known allergies.  Review of Systems   Review of Systems  Constitutional: Negative for chills and fever.  HENT: Negative for congestion  and facial swelling.   Eyes: Negative for discharge and visual disturbance.  Respiratory: Negative for shortness of breath.   Cardiovascular: Negative for chest pain and palpitations.  Gastrointestinal: Positive for abdominal pain, blood in stool, diarrhea and hematochezia. Negative for vomiting.  Musculoskeletal: Negative for arthralgias and myalgias.  Skin: Negative for color change and rash.  Neurological: Negative for tremors, syncope and headaches.  Psychiatric/Behavioral: Negative for confusion and dysphoric mood.    Physical Exam Updated Vital Signs BP (!) 153/100   Pulse 100   Temp 98.5 F (36.9 C) (Oral)   Resp 16   Ht  5\' 11"  (1.803 m)   Wt 62.9 kg   SpO2 100%   BMI 19.34 kg/m   Physical Exam Vitals and nursing note reviewed.  Constitutional:      Appearance: He is well-developed.  HENT:     Head: Normocephalic and atraumatic.  Eyes:     Pupils: Pupils are equal, round, and reactive to light.  Neck:     Vascular: No JVD.  Cardiovascular:     Rate and Rhythm: Normal rate and regular rhythm.     Heart sounds: No murmur heard.  No friction rub. No gallop.   Pulmonary:     Effort: No respiratory distress.     Breath sounds: No wheezing.  Abdominal:     General: There is no distension.     Tenderness: There is no guarding or rebound.     Comments: Diffusely tender without focality  Musculoskeletal:        General: Normal range of motion.     Cervical back: Normal range of motion and neck supple.  Skin:    Coloration: Skin is not pale.     Findings: No rash.  Neurological:     Mental Status: He is alert and oriented to person, place, and time.  Psychiatric:        Behavior: Behavior normal.     ED Results / Procedures / Treatments   Labs (all labs ordered are listed, but only abnormal results are displayed) Labs Reviewed  CBC WITH DIFFERENTIAL/PLATELET - Abnormal; Notable for the following components:      Result Value   Hemoglobin 12.2 (*)    HCT 38.4 (*)    RDW 16.1 (*)    All other components within normal limits  COMPREHENSIVE METABOLIC PANEL - Abnormal; Notable for the following components:   Potassium 3.2 (*)    Calcium 8.6 (*)    All other components within normal limits  LIPASE, BLOOD  URINALYSIS, ROUTINE W REFLEX MICROSCOPIC    EKG None  Radiology No results found.  Procedures Procedures (including critical care time)  Medications Ordered in ED Medications  predniSONE (DELTASONE) tablet 40 mg (has no administration in time range)  sodium chloride 0.9 % bolus 1,000 mL (1,000 mLs Intravenous New Bag/Given 09/01/20 1410)  morphine 4 MG/ML injection 8 mg (8 mg  Intravenous Given 09/01/20 1415)  ondansetron (ZOFRAN) injection 4 mg (4 mg Intravenous Given 09/01/20 1414)    ED Course  I have reviewed the triage vital signs and the nursing notes.  Pertinent labs & imaging results that were available during my care of the patient were reviewed by me and considered in my medical decision making (see chart for details).    MDM Rules/Calculators/A&P                          29 yo M with a chief complaints  of what sounds like a Crohn's flare.  Patient with diffuse abdominal pain for me on exam.  Otherwise looks fairly comfortable.  I discussed with him about obtaining lab work and attempting to treat his pain.  I would try to not image if possible if the patient is not having any significant change from his baseline flare.  I discussed with him also the limitations of burst of steroids in this scenario.      Will obtain abdominal labs, reassess.   No leukocytosis, no significant anemia.  No significant electrolyte abnormality.   Patient feeling better on reassessment.  We will do a taper of steroids as this is what is been previously done with GI.  We will have him follow-up with GI in the office.  Consult to social work to try and get him in to see the gastroenterologist.  2:41 PM:  I have discussed the diagnosis/risks/treatment options with the patient and family and believe the pt to be eligible for discharge home to follow-up with PCP, GI. We also discussed returning to the ED immediately if new or worsening sx occur. We discussed the sx which are most concerning (e.g., sudden worsening pain, fever, inability to tolerate by mouth) that necessitate immediate return. Medications administered to the patient during their visit and any new prescriptions provided to the patient are listed below.  Medications given during this visit Medications  predniSONE (DELTASONE) tablet 40 mg (has no administration in time range)  sodium chloride 0.9 % bolus 1,000 mL  (1,000 mLs Intravenous New Bag/Given 09/01/20 1410)  morphine 4 MG/ML injection 8 mg (8 mg Intravenous Given 09/01/20 1415)  ondansetron (ZOFRAN) injection 4 mg (4 mg Intravenous Given 09/01/20 1414)     The patient appears reasonably screen and/or stabilized for discharge and I doubt any other medical condition or other [facility] requiring further screening, evaluation, or treatment in the ED at this time prior to discharge.   Final Clinical Impression(s) / ED Diagnoses Final diagnoses:  Crohn's disease of colon with rectal bleeding (HCC)    Rx / DC Orders ED Discharge Orders         Ordered    morphine (MSIR) 15 MG tablet  Every 4 hours PRN        09/01/20 1438    ondansetron (ZOFRAN ODT) 4 MG disintegrating tablet        09/01/20 1438    predniSONE (DELTASONE) 10 MG tablet        09/01/20 1438           Melene Plan, DO 09/01/20 1441

## 2020-09-07 ENCOUNTER — Emergency Department (HOSPITAL_BASED_OUTPATIENT_CLINIC_OR_DEPARTMENT_OTHER)
Admission: EM | Admit: 2020-09-07 | Discharge: 2020-09-07 | Disposition: A | Discharge status: Home / Self Care | Payer: Self-pay | Attending: Emergency Medicine | Admitting: Emergency Medicine

## 2020-09-07 ENCOUNTER — Encounter (HOSPITAL_BASED_OUTPATIENT_CLINIC_OR_DEPARTMENT_OTHER): Payer: Self-pay | Admitting: *Deleted

## 2020-09-07 ENCOUNTER — Emergency Department (HOSPITAL_BASED_OUTPATIENT_CLINIC_OR_DEPARTMENT_OTHER): Payer: Self-pay

## 2020-09-07 ENCOUNTER — Other Ambulatory Visit: Payer: Self-pay

## 2020-09-07 DIAGNOSIS — R197 Diarrhea, unspecified: Secondary | ICD-10-CM | POA: Insufficient documentation

## 2020-09-07 DIAGNOSIS — E876 Hypokalemia: Secondary | ICD-10-CM | POA: Insufficient documentation

## 2020-09-07 DIAGNOSIS — R112 Nausea with vomiting, unspecified: Secondary | ICD-10-CM | POA: Insufficient documentation

## 2020-09-07 LAB — COMPREHENSIVE METABOLIC PANEL
ALT: 15 U/L (ref 0–44)
AST: 17 U/L (ref 15–41)
Albumin: 4.1 g/dL (ref 3.5–5.0)
Alkaline Phosphatase: 85 U/L (ref 38–126)
Anion gap: 11 (ref 5–15)
BUN: 14 mg/dL (ref 6–20)
CO2: 28 mmol/L (ref 22–32)
Calcium: 8.3 mg/dL — ABNORMAL LOW (ref 8.9–10.3)
Chloride: 105 mmol/L (ref 98–111)
Creatinine, Ser: 0.96 mg/dL (ref 0.61–1.24)
GFR, Estimated: >60 mL/min (ref >60)
Glucose, Bld: 102 mg/dL — ABNORMAL HIGH (ref 70–99)
Potassium: 2.8 mmol/L — ABNORMAL LOW (ref 3.5–5.1)
Sodium: 144 mmol/L (ref 135–145)
Total Bilirubin: 0.2 mg/dL — ABNORMAL LOW (ref 0.3–1.2)
Total Protein: 6.9 g/dL (ref 6.5–8.1)

## 2020-09-07 LAB — CBC WITH DIFFERENTIAL/PLATELET
Abs Immature Granulocytes: 0.04 10*3/uL (ref 0.00–0.07)
Basophils Absolute: 0.1 10*3/uL (ref 0.0–0.1)
Basophils Relative: 1 %
Eosinophils Absolute: 0.1 10*3/uL (ref 0.0–0.5)
Eosinophils Relative: 1 %
HCT: 41.8 % (ref 39.0–52.0)
Hemoglobin: 13.1 g/dL (ref 13.0–17.0)
Immature Granulocytes: 0 %
Lymphocytes Relative: 21 %
Lymphs Abs: 2.2 10*3/uL (ref 0.7–4.0)
MCH: 26.9 pg (ref 26.0–34.0)
MCHC: 31.3 g/dL (ref 30.0–36.0)
MCV: 85.8 fL (ref 80.0–100.0)
Monocytes Absolute: 0.9 10*3/uL (ref 0.1–1.0)
Monocytes Relative: 9 %
Neutro Abs: 7 10*3/uL (ref 1.7–7.7)
Neutrophils Relative %: 68 %
Platelets: 232 10*3/uL (ref 150–400)
RBC: 4.87 MIL/uL (ref 4.22–5.81)
RDW: 17.1 % — ABNORMAL HIGH (ref 11.5–15.5)
WBC: 10.2 10*3/uL (ref 4.0–10.5)
nRBC: 0 % (ref 0.0–0.2)

## 2020-09-07 LAB — LIPASE, BLOOD: Lipase: 41 U/L (ref 11–51)

## 2020-09-07 MED ORDER — ONDANSETRON HCL 4 MG/2ML IJ SOLN
4.0000 mg | Freq: Once | INTRAMUSCULAR | Status: AC
  Administered 2020-09-07: 4 mg via INTRAVENOUS
  Filled 2020-09-07: qty 2

## 2020-09-07 MED ORDER — HYDROMORPHONE HCL 1 MG/ML IJ SOLN
0.5000 mg | Freq: Once | INTRAMUSCULAR | Status: AC
  Administered 2020-09-07: 0.5 mg via INTRAVENOUS
  Filled 2020-09-07: qty 1

## 2020-09-07 MED ORDER — DICYCLOMINE HCL 10 MG/ML IM SOLN
20.0000 mg | Freq: Once | INTRAMUSCULAR | Status: AC
  Administered 2020-09-07: 20 mg via INTRAMUSCULAR
  Filled 2020-09-07: qty 2

## 2020-09-07 MED ORDER — OXYCODONE-ACETAMINOPHEN 5-325 MG PO TABS
1.0000 | ORAL_TABLET | Freq: Once | ORAL | Status: AC
  Administered 2020-09-07: 1 via ORAL
  Filled 2020-09-07: qty 1

## 2020-09-07 MED ORDER — POTASSIUM CHLORIDE ER 10 MEQ PO TBCR: 40.0000 meq | EXTENDED_RELEASE_TABLET | Freq: Every day | ORAL | 0 refills | Status: DC

## 2020-09-07 MED ORDER — POTASSIUM CHLORIDE 10 MEQ/100ML IV SOLN
10.0000 meq | INTRAVENOUS | Status: AC
  Administered 2020-09-07 (×2): 10 meq via INTRAVENOUS
  Filled 2020-09-07 (×2): qty 100

## 2020-09-07 MED ORDER — METOCLOPRAMIDE HCL 10 MG PO TABS: 10.0000 mg | ORAL_TABLET | Freq: Four times a day (QID) | ORAL | 0 refills | Status: AC | PRN

## 2020-09-07 MED ORDER — MAGNESIUM SULFATE 2 GM/50ML IV SOLN
2.0000 g | Freq: Once | INTRAVENOUS | Status: AC
  Administered 2020-09-07: 2 g via INTRAVENOUS
  Filled 2020-09-07: qty 50

## 2020-09-07 MED ORDER — IOHEXOL 300 MG/ML  SOLN
100.0000 mL | Freq: Once | INTRAMUSCULAR | Status: AC | PRN
  Administered 2020-09-07: 100 mL via INTRAVENOUS

## 2020-09-07 MED ORDER — SODIUM CHLORIDE 0.9 % IV BOLUS
1000.0000 mL | Freq: Once | INTRAVENOUS | Status: AC
  Administered 2020-09-07: 1000 mL via INTRAVENOUS

## 2020-09-07 MED ORDER — DICYCLOMINE HCL 20 MG PO TABS: 20.0000 mg | ORAL_TABLET | Freq: Two times a day (BID) | ORAL | 0 refills | Status: DC

## 2020-09-07 MED ORDER — MORPHINE SULFATE (PF) 4 MG/ML IV SOLN
4.0000 mg | Freq: Once | INTRAVENOUS | Status: AC
  Administered 2020-09-07: 4 mg via INTRAVENOUS
  Filled 2020-09-07: qty 1

## 2020-09-07 NOTE — ED Notes (Signed)
Pt reports pain returning at 7/10.  Provider made aware, will speak to patient regarding pain management goals.

## 2020-09-07 NOTE — ED Notes (Signed)
Patient transported to CT 

## 2020-09-07 NOTE — Discharge Instructions (Signed)
Take the medications to help with your symptoms. Make sure you are drinking plenty of fluids and slowly advancing your diet as tolerated. Take the potassium pills as directed.  You will need to have your potassium level rechecked in 1 week. Follow-up with the GI doctor. Return to the ER if you start to experience fever, chest pain, shortness of breath.

## 2020-09-07 NOTE — ED Triage Notes (Addendum)
Abdominal pain started last night with nausea and vomiting.  Has history of Cronh's

## 2020-09-07 NOTE — ED Provider Notes (Signed)
[facility] EMERGENCY DEPARTMENT Provider Note   CSN: [id number] Arrival date & time: 09/07/20  1127     History Chief Complaint  Patient presents with  . Abdominal Pain    Raymond Huerta is a 29 y.o. male with a past medical history of Crohn's disease not on any medications presenting to the ED with a chief complaint of abdominal pain, diarrhea and rectal pain.  Patient was seen and evaluated on 09/01/2020 for similar but milder symptoms.  He was diagnosed with a Crohn's flareup, discharged home with Zofran, morphine and prednisone taper.  States that he has been taking the prednisone but was unable to afford the Zofran.  He did take the morphine for 24 hours after his ED visit but then ran out.  For the past 1 to 2 days his pain has worsened.  Located in the lower abdomen and rectum.  States that this does feel somewhat different than his usual Crohn's flareups which usually improve with steroids.  He has not seen a GI doctor in several months due to lack of insurance but is scheduled to see 1 next week.  He states that the blood in his stool has significantly improved since his visit last week.  Reports nausea and vomiting.  No fevers, urinary symptoms, chest pain, shortness of breath.  Prior abdominal surgeries include cholecystectomy and colectomy.  HPI     Past Medical History:  Diagnosis Date  . Crohn disease (HCC)     There are no problems to display for this patient.   Past Surgical History:  Procedure Laterality Date  . ABDOMINAL SURGERY    . WRIST SURGERY         History reviewed. No pertinent family history.  Social History   Tobacco Use  . Smoking status: Never Smoker  . Smokeless tobacco: Never Used  Substance Use Topics  . Alcohol use: Never  . Drug use: Never    Home Medications Prior to Admission medications   Medication Sig Start Date End Date Taking? Authorizing Provider  dicyclomine (BENTYL) 20 MG tablet Take 1 tablet (20 mg total)  by mouth 2 (two) times daily. 09/07/20   Quantia Grullon, PA-C  dronabinol (MARINOL) 10 MG capsule TAKE 1 CAPSULE BY MOUTH TWICE A DAY BEFORE MEALS 03/25/20   [provider]  metoCLOPramide (REGLAN) 10 MG tablet Take 1 tablet (10 mg total) by mouth every 6 (six) hours as needed for nausea. 09/07/20   Mikias Lanz, PA-C  metoprolol tartrate (LOPRESSOR) 25 MG tablet Take 25 mg by mouth 2 (two) times daily. 04/07/20   [provider]  morphine (MSIR) 15 MG tablet Take 0.5 tablets (7.5 mg total) by mouth every 4 (four) hours as needed for severe pain. 09/01/20   Melene Plan, DO  ondansetron (ZOFRAN ODT) 4 MG disintegrating tablet  ODT q4 hours prn nausea/vomit 09/01/20   Melene Plan, DO  potassium chloride (KLOR-CON) 10 MEQ tablet Take 4 tablets (40 mEq total) by mouth daily for 4 days. 09/07/20 09/11/20  Rayyan Burley, PA-C  predniSONE (DELTASONE) 10 MG tablet Take (4 tabs) PO x5 days Take (3tabs) PO x5 days Take (2tabs)PO x5 days Take (1tab)PO x 5 days 09/01/20   Melene Plan, DO  promethazine (PHENERGAN) 25 MG suppository Place 1 suppository (25 mg total) rectally every 6 (six) hours as needed for nausea or vomiting. 04/23/20   Alvira Monday, MD  ustekinumab (STELARA) 90 MG/ML SOSY injection Inject into the skin. 03/14/20   [provider]  VYVANSE 60 MG capsule Take 60 mg by mouth every morning. 01/29/20   [provider]    Allergies    Patient has no known allergies.  Review of Systems   Review of Systems  Constitutional: Negative for appetite change, chills and fever.  HENT: Negative for ear pain, rhinorrhea, sneezing and sore throat.   Eyes: Negative for photophobia and visual disturbance.  Respiratory: Negative for cough, chest tightness, shortness of breath and wheezing.   Cardiovascular: Negative for chest pain and palpitations.  Gastrointestinal: Positive for abdominal pain, blood in stool, diarrhea, nausea, rectal pain and vomiting. Negative  for constipation.  Genitourinary: Negative for dysuria, hematuria and urgency.  Musculoskeletal: Negative for myalgias.  Skin: Negative for rash.  Neurological: Negative for dizziness, weakness and light-headedness.    Physical Exam Updated Vital Signs BP (!) 137/99   Pulse 65   Temp 97.6 F (36.4 C) (Oral)   Resp 18   Ht 5\' 11"  (1.803 m)   Wt 61.2 kg   SpO2 98%   BMI 18.83 kg/m   Physical Exam Vitals and nursing note reviewed.  Constitutional:      General: He is not in acute distress.    Appearance: He is well-developed.  HENT:     Head: Normocephalic and atraumatic.     Nose: Nose normal.  Eyes:     General: No scleral icterus.       Left eye: No discharge.     Conjunctiva/sclera: Conjunctivae normal.  Cardiovascular:     Rate and Rhythm: Normal rate and regular rhythm.     Heart sounds: Normal heart sounds. No murmur heard.  No friction rub. No gallop.   Pulmonary:     Effort: Pulmonary effort is normal. No respiratory distress.     Breath sounds: Normal breath sounds.  Abdominal:     General: Bowel sounds are normal. There is no distension.     Palpations: Abdomen is soft.     Tenderness: There is abdominal tenderness in the right lower quadrant, suprapubic area and left lower quadrant. There is no guarding.  Musculoskeletal:        General: Normal range of motion.     Cervical back: Normal range of motion and neck supple.  Skin:    General: Skin is warm and dry.     Findings: No rash.  Neurological:     Mental Status: He is alert.     Motor: No abnormal muscle tone.     Coordination: Coordination normal.     ED Results / Procedures / Treatments   Labs (all labs ordered are listed, but only abnormal results are displayed) Labs Reviewed  COMPREHENSIVE METABOLIC PANEL - Abnormal; Notable for the following components:      Result Value   Potassium 2.8 (*)    Glucose, Bld 102 (*)    Calcium 8.3 (*)    Total Bilirubin 0.2 (*)    All other components  within normal limits  CBC WITH DIFFERENTIAL/PLATELET - Abnormal; Notable for the following components:   RDW 17.1 (*)    All other components within normal limits  LIPASE, BLOOD    EKG None  Radiology CT ABDOMEN PELVIS W CONTRAST  Result Date: 09/07/2020 CLINICAL DATA:  Lower quadrant abdominal pain in a 29 year old male EXAM: CT ABDOMEN AND PELVIS WITH CONTRAST TECHNIQUE: Multidetector CT imaging of the abdomen and pelvis was performed using the standard protocol following bolus administration of intravenous contrast. CONTRAST:  26 OMNIPAQUE IOHEXOL 300 MG/ML  SOLN COMPARISON:  08/20/2020 FINDINGS: Lower chest: Incidental imaging of the lung bases without consolidation or pleural effusion. Hepatobiliary: Liver is normal. Post cholecystectomy. Portal vein is patent. Hepatic veins are patent. Pancreas: Normal Spleen: Normal Adrenals/Urinary Tract: Normal appearance of the bilateral adrenal glands. Symmetric renal enhancement. No sign of hydronephrosis. Stomach/Bowel: Stomach distended with ingested contents. Small bowel is normal caliber, collapsed limiting assessment through much of its course. Signs of probable ileocecal resection and ileocolonic anastomosis in the RIGHT lower quadrant. No sign of bowel obstruction with positive contrast material passing well into the colon on the current study. Vascular/Lymphatic: Patent abdominal vasculature without aneurysmal dilation. There is no gastrohepatic or hepatoduodenal ligament lymphadenopathy. No retroperitoneal or mesenteric lymphadenopathy. No pelvic sidewall lymphadenopathy. Reproductive: Heterogeneous prostate, nonspecific on CT. Similar heterogeneity seen on previous imaging studies. Other: No ascites.  No free air. Musculoskeletal: No acute bone finding. No destructive bone process. IMPRESSION: 1. Signs of probable ileocecal resection and ileocolonic anastomosis in the RIGHT lower quadrant. No acute gastrointestinal process. 2. Post  cholecystectomy no biliary duct dilation. 3. Stomach with moderate to marked distension but without perigastric stranding. Correlate with any current symptoms of impaired gastric emptying. This may simply represent recent ingestion of a large meal. Electronically Signed   By: Donzetta Kohut M.D.   On: 09/07/2020 13:06    Procedures Procedures (including critical care time)  Medications Ordered in ED Medications  potassium chloride 10 mEq in 100 mL IVPB (10 mEq Intravenous New Bag/Given 09/07/20 1436)  oxyCODONE-acetaminophen (PERCOCET/ROXICET) 5-325 MG per tablet 1 tablet (has no administration in time range)  sodium chloride 0.9 % bolus 1,000 mL (0 mLs Intravenous Stopped 09/07/20 1353)  ondansetron (ZOFRAN) injection 4 mg (4 mg Intravenous Given 09/07/20 1230)  morphine 4 MG/ML injection 4 mg (4 mg Intravenous Given 09/07/20 1231)  iohexol (OMNIPAQUE) 300 MG/ML solution 100 mL (100 mLs Intravenous Contrast Given 09/07/20 1240)  dicyclomine (BENTYL) injection 20 mg (20 mg Intramuscular Given 09/07/20 1329)  magnesium sulfate IVPB 2 g 50 mL (0 g Intravenous Stopped 09/07/20 1433)  HYDROmorphone (DILAUDID) injection 0.5 mg (0.5 mg Intravenous Given 09/07/20 1328)    ED Course  I have reviewed the triage vital signs and the nursing notes.  Pertinent labs & imaging results that were available during my care of the patient were reviewed by me and considered in my medical decision making (see chart for details).  Clinical Course as of Sep 07 1528  Wed Sep 07, 2020  1321 Will replete IV and oral.  Potassium(!): 2.8 [HK]  1322 No acute abnormalities or any complicating features of Crohn's disease.  CT ABDOMEN PELVIS W CONTRAST [HK]    Clinical Course User Index [HK] Dietrich Pates, PA-C   MDM Rules/Calculators/A&P                          29 year old male presenting to the ED with abdominal pain, vomiting and rectal pain.  Seen and evaluated about 6 days ago for similar but milder symptoms and  diagnosed with Crohn's flareup.  Pain has worsened over the past 1 to 2 days and does not feel similar to his prior Crohn's flareups.  Unfortunately he is not currently followed by GI but is scheduled to see them next week.  Reports his bloody stools have gradually improved.  Abdomen is tender in the lower quadrant without rebound or guarding.  Reviewed work-up from last week without any significant abnormalities.  Due to patient's worsening symptoms  despite being treated for Crohn's flareup with prednisone will obtain repeat lab work and consider imaging to evaluate for complicating features of Crohn's.  Will attempt to control symptoms while awaiting lab work.  Work-up significant for hypokalemia of 2.8 which was repleted IV along with IV magnesium.  CBC and lipase unremarkable.  CT of the abdomen pelvis without any acute findings or complicating features of Crohn's.  Patient symptoms controlled here intermittently with IV medications.  Informed patient that we will not be able to completely control his pain but most importantly want to manage his nausea and vomiting so that he can tolerate p.o. intake.  He appears overall comfortable and vital signs are within normal limits.  We have given several rounds of IV pain medication.  Will discharge home with remainder of potassium course instructions to recheck potassium level in 1 week, antiemetics and antispasmodics along with medication coupon to make the more affordable as he states that he was unable to afford the Zofran.  It is important for him to follow-up with GI doctor for ongoing management of his Crohn's disease.  Return precautions given.   Patient is hemodynamically stable, in NAD, and able to ambulate in the ED. Evaluation does not show pathology that would require ongoing emergent intervention or inpatient treatment. I explained the diagnosis to the patient. Pain has been managed and has no complaints prior to discharge. Patient is comfortable with  above plan and is stable for discharge at this time. All questions were answered prior to disposition. Strict return precautions for returning to the ED were discussed. Encouraged follow up with PCP.   An After Visit Summary was printed and given to the patient.   Portions of this note were generated with Scientist, clinical (histocompatibility and immunogenetics). Dictation errors may occur despite best attempts at proofreading.  Final Clinical Impression(s) / ED Diagnoses Final diagnoses:  Nausea vomiting and diarrhea  Hypokalemia    Rx / DC Orders ED Discharge Orders         Ordered    dicyclomine (BENTYL) 20 MG tablet  2 times daily        09/07/20 1529    metoCLOPramide (REGLAN) 10 MG tablet  Every 6 hours PRN        09/07/20 1529    potassium chloride (KLOR-CON) 10 MEQ tablet  Daily        09/07/20 1529           Dietrich Pates, PA-C 09/07/20 1532    Linwood Dibbles, MD 09/11/20 [phone]

## 2020-09-12 ENCOUNTER — Inpatient Hospital Stay (HOSPITAL_BASED_OUTPATIENT_CLINIC_OR_DEPARTMENT_OTHER)
Admission: EM | Admit: 2020-09-12 | Discharge: 2020-09-16 | DRG: 386 | Disposition: A | Discharge status: Home / Self Care | Payer: Self-pay | Attending: Internal Medicine | Admitting: Internal Medicine

## 2020-09-12 ENCOUNTER — Other Ambulatory Visit: Payer: Self-pay

## 2020-09-12 ENCOUNTER — Encounter (HOSPITAL_BASED_OUTPATIENT_CLINIC_OR_DEPARTMENT_OTHER): Payer: Self-pay | Admitting: Emergency Medicine

## 2020-09-12 DIAGNOSIS — Z681 Body mass index (BMI) 19 or less, adult: Secondary | ICD-10-CM

## 2020-09-12 DIAGNOSIS — E538 Deficiency of other specified B group vitamins: Secondary | ICD-10-CM | POA: Diagnosis present

## 2020-09-12 DIAGNOSIS — K5090 Crohn's disease, unspecified, without complications: Secondary | ICD-10-CM | POA: Diagnosis present

## 2020-09-12 DIAGNOSIS — Z20822 Contact with and (suspected) exposure to covid-19: Secondary | ICD-10-CM | POA: Diagnosis present

## 2020-09-12 DIAGNOSIS — R1031 Right lower quadrant pain: Secondary | ICD-10-CM

## 2020-09-12 DIAGNOSIS — R109 Unspecified abdominal pain: Secondary | ICD-10-CM

## 2020-09-12 DIAGNOSIS — R739 Hyperglycemia, unspecified: Secondary | ICD-10-CM | POA: Diagnosis present

## 2020-09-12 DIAGNOSIS — Z79899 Other long term (current) drug therapy: Secondary | ICD-10-CM

## 2020-09-12 DIAGNOSIS — T380X5A Adverse effect of glucocorticoids and synthetic analogues, initial encounter: Secondary | ICD-10-CM | POA: Diagnosis present

## 2020-09-12 DIAGNOSIS — F419 Anxiety disorder, unspecified: Secondary | ICD-10-CM | POA: Diagnosis present

## 2020-09-12 DIAGNOSIS — D649 Anemia, unspecified: Secondary | ICD-10-CM | POA: Diagnosis present

## 2020-09-12 DIAGNOSIS — Z9049 Acquired absence of other specified parts of digestive tract: Secondary | ICD-10-CM

## 2020-09-12 DIAGNOSIS — I10 Essential (primary) hypertension: Secondary | ICD-10-CM | POA: Diagnosis present

## 2020-09-12 DIAGNOSIS — K50911 Crohn's disease, unspecified, with rectal bleeding: Principal | ICD-10-CM | POA: Diagnosis present

## 2020-09-12 DIAGNOSIS — R110 Nausea: Secondary | ICD-10-CM

## 2020-09-12 DIAGNOSIS — D72829 Elevated white blood cell count, unspecified: Secondary | ICD-10-CM | POA: Diagnosis present

## 2020-09-12 DIAGNOSIS — E876 Hypokalemia: Secondary | ICD-10-CM | POA: Diagnosis present

## 2020-09-12 DIAGNOSIS — R634 Abnormal weight loss: Secondary | ICD-10-CM | POA: Diagnosis present

## 2020-09-12 DIAGNOSIS — F32A Depression, unspecified: Secondary | ICD-10-CM | POA: Diagnosis present

## 2020-09-12 DIAGNOSIS — Y929 Unspecified place or not applicable: Secondary | ICD-10-CM

## 2020-09-12 DIAGNOSIS — K529 Noninfective gastroenteritis and colitis, unspecified: Secondary | ICD-10-CM

## 2020-09-12 LAB — CBC WITH DIFFERENTIAL/PLATELET
Abs Immature Granulocytes: 0.38 10*3/uL — ABNORMAL HIGH (ref 0.00–0.07)
Basophils Absolute: 0.1 10*3/uL (ref 0.0–0.1)
Basophils Relative: 1 %
Eosinophils Absolute: 0.1 10*3/uL (ref 0.0–0.5)
Eosinophils Relative: 1 %
HCT: 37.7 % — ABNORMAL LOW (ref 39.0–52.0)
Hemoglobin: 11.8 g/dL — ABNORMAL LOW (ref 13.0–17.0)
Immature Granulocytes: 3 %
Lymphocytes Relative: 7 %
Lymphs Abs: 0.9 10*3/uL (ref 0.7–4.0)
MCH: 26.3 pg (ref 26.0–34.0)
MCHC: 31.3 g/dL (ref 30.0–36.0)
MCV: 84.2 fL (ref 80.0–100.0)
Monocytes Absolute: 0.4 10*3/uL (ref 0.1–1.0)
Monocytes Relative: 3 %
Neutro Abs: 10.8 10*3/uL — ABNORMAL HIGH (ref 1.7–7.7)
Neutrophils Relative %: 85 %
Platelets: 228 10*3/uL (ref 150–400)
RBC: 4.48 MIL/uL (ref 4.22–5.81)
RDW: 16.5 % — ABNORMAL HIGH (ref 11.5–15.5)
WBC: 12.6 10*3/uL — ABNORMAL HIGH (ref 4.0–10.5)
nRBC: 0 % (ref 0.0–0.2)

## 2020-09-12 LAB — URINALYSIS, ROUTINE W REFLEX MICROSCOPIC
Bilirubin Urine: NEGATIVE
Glucose, UA: NEGATIVE mg/dL
Hgb urine dipstick: NEGATIVE
Ketones, ur: NEGATIVE mg/dL
Leukocytes,Ua: NEGATIVE
Nitrite: NEGATIVE
Protein, ur: NEGATIVE mg/dL
Specific Gravity, Urine: 1.01 (ref 1.005–1.030)
pH: 6 (ref 5.0–8.0)

## 2020-09-12 LAB — COMPREHENSIVE METABOLIC PANEL
ALT: 21 U/L (ref 0–44)
AST: 28 U/L (ref 15–41)
Albumin: 3.9 g/dL (ref 3.5–5.0)
Alkaline Phosphatase: 85 U/L (ref 38–126)
Anion gap: 13 (ref 5–15)
BUN: 10 mg/dL (ref 6–20)
CO2: 26 mmol/L (ref 22–32)
Calcium: 9.1 mg/dL (ref 8.9–10.3)
Chloride: 100 mmol/L (ref 98–111)
Creatinine, Ser: 0.76 mg/dL (ref 0.61–1.24)
GFR, Estimated: >60 mL/min (ref >60)
Glucose, Bld: 129 mg/dL — ABNORMAL HIGH (ref 70–99)
Potassium: 4.2 mmol/L (ref 3.5–5.1)
Sodium: 139 mmol/L (ref 135–145)
Total Bilirubin: 0.2 mg/dL — ABNORMAL LOW (ref 0.3–1.2)
Total Protein: 6.8 g/dL (ref 6.5–8.1)

## 2020-09-12 LAB — C-REACTIVE PROTEIN: CRP: 0.6 mg/dL (ref <1.0)

## 2020-09-12 LAB — LIPASE, BLOOD: Lipase: 30 U/L (ref 11–51)

## 2020-09-12 LAB — RESPIRATORY PANEL BY RT PCR (FLU A&B, COVID)
Influenza A by PCR: NEGATIVE
Influenza B by PCR: NEGATIVE
SARS Coronavirus 2 by RT PCR: NEGATIVE

## 2020-09-12 MED ORDER — ACETAMINOPHEN 325 MG PO TABS: 650.0000 mg | ORAL_TABLET | ORAL | Status: DC | PRN

## 2020-09-12 MED ORDER — ONDANSETRON HCL 4 MG/2ML IJ SOLN
4.0000 mg | Freq: Four times a day (QID) | INTRAMUSCULAR | Status: DC | PRN
  Administered 2020-09-12 – 2020-09-16 (×11): 4 mg via INTRAVENOUS
  Filled 2020-09-12 (×12): qty 2

## 2020-09-12 MED ORDER — METHYLPREDNISOLONE SODIUM SUCC 125 MG IJ SOLR
60.0000 mg | Freq: Every day | INTRAMUSCULAR | Status: DC
  Administered 2020-09-13 – 2020-09-16 (×4): 60 mg via INTRAVENOUS
  Filled 2020-09-12 (×4): qty 2

## 2020-09-12 MED ORDER — ONDANSETRON HCL 4 MG/2ML IJ SOLN
4.0000 mg | Freq: Once | INTRAMUSCULAR | Status: AC
  Administered 2020-09-12: 4 mg via INTRAVENOUS
  Filled 2020-09-12: qty 2

## 2020-09-12 MED ORDER — PROMETHAZINE HCL 25 MG/ML IJ SOLN
12.5000 mg | Freq: Once | INTRAMUSCULAR | Status: AC
  Administered 2020-09-12: 12.5 mg via INTRAVENOUS
  Filled 2020-09-12: qty 1

## 2020-09-12 MED ORDER — LISDEXAMFETAMINE DIMESYLATE 20 MG PO CAPS
40.0000 mg | ORAL_CAPSULE | Freq: Every day | ORAL | Status: DC
  Administered 2020-09-15 – 2020-09-16 (×2): 40 mg via ORAL
  Filled 2020-09-12 (×2): qty 2

## 2020-09-12 MED ORDER — MORPHINE SULFATE (PF) 4 MG/ML IV SOLN
4.0000 mg | INTRAVENOUS | Status: DC | PRN
  Filled 2020-09-12: qty 1

## 2020-09-12 MED ORDER — OXYCODONE-ACETAMINOPHEN 5-325 MG PO TABS
1.0000 | ORAL_TABLET | ORAL | Status: DC | PRN
  Administered 2020-09-12 – 2020-09-13 (×3): 1 via ORAL
  Filled 2020-09-12 (×3): qty 1

## 2020-09-12 MED ORDER — MORPHINE SULFATE (PF) 4 MG/ML IV SOLN
4.0000 mg | INTRAVENOUS | Status: DC | PRN
  Administered 2020-09-12 (×3): 4 mg via INTRAVENOUS
  Filled 2020-09-12 (×2): qty 1

## 2020-09-12 MED ORDER — AMPHETAMINE-DEXTROAMPHET ER 5 MG PO CP24
15.0000 mg | ORAL_CAPSULE | Freq: Once | ORAL | Status: AC
  Administered 2020-09-12: 15 mg via ORAL
  Filled 2020-09-12: qty 3

## 2020-09-12 MED ORDER — METHYLPREDNISOLONE SODIUM SUCC 125 MG IJ SOLR
125.0000 mg | Freq: Once | INTRAMUSCULAR | Status: AC
  Administered 2020-09-12: 125 mg via INTRAVENOUS
  Filled 2020-09-12: qty 2

## 2020-09-12 MED ORDER — SODIUM CHLORIDE 0.9 % IV SOLN: Freq: Once | INTRAVENOUS | Status: AC

## 2020-09-12 MED ORDER — HYDROMORPHONE HCL 1 MG/ML IJ SOLN
1.0000 mg | Freq: Once | INTRAMUSCULAR | Status: AC
  Administered 2020-09-12: 1 mg via INTRAVENOUS
  Filled 2020-09-12: qty 1

## 2020-09-12 MED ORDER — PROMETHAZINE HCL 25 MG RE SUPP
25.0000 mg | Freq: Four times a day (QID) | RECTAL | Status: DC | PRN
  Filled 2020-09-12: qty 1

## 2020-09-12 MED ORDER — KETAMINE HCL 10 MG/ML IJ SOLN
0.3000 mg/kg | Freq: Once | INTRAMUSCULAR | Status: AC
  Administered 2020-09-12: 18 mg via INTRAVENOUS
  Filled 2020-09-12: qty 1

## 2020-09-12 MED ORDER — METOPROLOL TARTRATE 25 MG PO TABS
25.0000 mg | ORAL_TABLET | Freq: Two times a day (BID) | ORAL | Status: DC
  Administered 2020-09-12 – 2020-09-16 (×8): 25 mg via ORAL
  Filled 2020-09-12 (×8): qty 1

## 2020-09-12 MED ORDER — FENTANYL CITRATE (PF) 100 MCG/2ML IJ SOLN
25.0000 ug | INTRAMUSCULAR | Status: DC | PRN
  Administered 2020-09-12 – 2020-09-13 (×5): 25 ug via INTRAVENOUS
  Filled 2020-09-12 (×5): qty 2

## 2020-09-12 MED ORDER — SODIUM CHLORIDE 0.9 % IV BOLUS
1000.0000 mL | Freq: Once | INTRAVENOUS | Status: AC
  Administered 2020-09-12: 1000 mL via INTRAVENOUS

## 2020-09-12 MED ORDER — LISDEXAMFETAMINE DIMESYLATE 20 MG PO CAPS: 40.0000 mg | ORAL_CAPSULE | Freq: Every morning | ORAL | Status: DC

## 2020-09-12 MED ORDER — HYDRALAZINE HCL 20 MG/ML IJ SOLN
10.0000 mg | Freq: Four times a day (QID) | INTRAMUSCULAR | Status: DC | PRN
  Administered 2020-09-16: 15:00:00 10 mg via INTRAVENOUS
  Filled 2020-09-12: qty 1

## 2020-09-12 MED ORDER — LACTATED RINGERS IV SOLN: INTRAVENOUS | Status: DC

## 2020-09-12 MED ORDER — LORAZEPAM 1 MG PO TABS
1.0000 mg | ORAL_TABLET | Freq: Two times a day (BID) | ORAL | Status: DC | PRN
  Administered 2020-09-12 – 2020-09-16 (×9): 1 mg via ORAL
  Filled 2020-09-12 (×10): qty 1

## 2020-09-12 MED ORDER — MORPHINE SULFATE (PF) 4 MG/ML IV SOLN
4.0000 mg | Freq: Once | INTRAVENOUS | Status: AC
  Administered 2020-09-12: 4 mg via INTRAVENOUS
  Filled 2020-09-12: qty 1

## 2020-09-12 NOTE — ED Triage Notes (Signed)
Seen on the 3rd for Crohn's flair.  Continues to have RLQ and umbilical pain with n/v and bloody diarrhea.  Still on prednisone currently.

## 2020-09-12 NOTE — ED Notes (Signed)
EDP at bedside  

## 2020-09-12 NOTE — Consult Note (Signed)
Consultation  Referring Provider: Dr. Marland Mcalpine      Primary Care Physician:  Pcp, No Primary Gastroenterologist: [facility]       Reason for Consultation: Crohn's disease with complication            HPI:   Raymond Huerta is a 29 y.o. male with a past medical history of Crohn's disease and a history of hemicolectomy, who presented to [facility] today with ongoing abdominal pain and bloody stools, we are consulted for his Crohn's disease with complication.    Today, the patient explains that he has progressive and ongoing central periumbilical abdominal pain and right lower quadrant pain, it is nonradiating, and rated as an 8/10.  Has not been taking pain medication at home and reports difficulty keeping anything down.  Also ongoing bloody stools at least 5 to 6/day, currently on 30 mg of prednisone for presumed Crohn's flare (taper was started a week ago, he has been tapering down by 5 mg every 5 days as instructed).  Explains that he is typically on Stelara for maintenance, but he was started on this 6 months ago and was able to continue it for a total of 3 months but then has been off for the past 3 months because he changed jobs again.  Tells me he is not going to get insurance again for another month.  He is eager to have a colonoscopy because he wants to know how bad things are.  Associated symptoms include a rectal throbbing and a 12 pound weight loss over the past week and a half due to nausea and vomiting of anything he puts in his mouth.    Explains that he knows he needs to be on something but it has been difficult with the stress of taking care of his 33-year-old and trying to maintain a job as well as taking care of an elderly family member who has health difficulties.  Describes a stress brings on his flares.    Denies fever or chills.  Per chart review: Patient has had 4 emergency room visits since October 15 for the same, started on prednisone taper on October 28 for  presumed Crohn's flare, has not been able to follow-up with gastroenterologist  GI history: 08/19/2020 ER visit at [facility] for right lower quadrant pain: At that time reported history of hemicolectomy, described a flareup since yesterday with describes seeing some blood in stool; he was given dicyclomine 07/07/2019 patient seen via telemedicine by [facility]: Was noted that he had diarrhea, he had been to the ER with abdominal pain and had a stool test that according E. coli, started on Cipro 500 mg and diarrhea but improving not taking Stelara since April and then received 1 dose which is job and lost his insurance; at that time recommend he restart Stelara, recommend a colonoscopy prior to restart and started on Marinol 5 mg 1 p.o. twice daily  Past Medical History:  Diagnosis Date  . Crohn disease (HCC)   . Kienbock's disease     Past Surgical History:  Procedure Laterality Date  . ABDOMINAL SURGERY    . WRIST SURGERY      Family History: No GI cancer  Social History   Tobacco Use  . Smoking status: Never Smoker  . Smokeless tobacco: Never Used  Substance Use Topics  . Alcohol use: Never  . Drug use: Never    Prior to Admission medications   Medication Sig Start Date End Date  Taking? Authorizing Provider  dronabinol (MARINOL) 10 MG capsule TAKE 1 CAPSULE BY MOUTH TWICE A DAY BEFORE MEALS 03/25/20  Yes [provider]  ondansetron (ZOFRAN ODT) 4 MG disintegrating tablet 4mg  ODT q4 hours prn nausea/vomit 09/01/20  Yes 09/03/20, Dan, DO  predniSONE (DELTASONE) 10 MG tablet Take 40mg (4 tabs) PO x5 days Take 30mg (3tabs) PO x5 days Take 20mg (2tabs)PO x5 days Take 10mg (1tab)PO x 5 days 09/01/20  Yes , DO  ustekinumab (STELARA) 90 MG/ML SOSY injection Inject into the skin. 03/14/20  Yes [provider]  VYVANSE 60 MG capsule Take 60 mg by mouth every morning. 01/29/20  Yes [provider]  dicyclomine (BENTYL) 20 MG tablet Take 1 tablet (20 mg total) by  mouth 2 (two) times daily. 09/07/20   Khatri, Hina, PA-C  metoCLOPramide (REGLAN) 10 MG tablet Take 1 tablet (10 mg total) by mouth every 6 (six) hours as needed for nausea. 09/07/20   Khatri, Hina, PA-C  metoprolol tartrate (LOPRESSOR) 25 MG tablet Take 25 mg by mouth 2 (two) times daily. 04/07/20   [provider]  morphine (MSIR) 15 MG tablet Take 0.5 tablets (7.5 mg total) by mouth every 4 (four) hours as needed for severe pain. 09/01/20   01/31/20, DO  potassium chloride (KLOR-CON) 10 MEQ tablet Take 4 tablets (40 mEq total) by mouth daily for 4 days. 09/07/20 09/11/20  06/07/20, PA-C  promethazine (PHENERGAN) 25 MG suppository Place 1 suppository (25 mg total) rectally every 6 (six) hours as needed for nausea or vomiting. 04/23/20   Melene Plan, MD    Current Facility-Administered Medications  Medication Dose Route Frequency Provider Last Rate Last Admin  . acetaminophen (TYLENOL) tablet 650 mg  650 mg Oral Q4H PRN Shalhoub, 13/3/21, MD      . lactated ringers infusion   Intravenous Continuous 13/7/21 Dietrich Pates, MD 125 mL/hr at 09/12/20 1453 New Bag at 09/12/20 1453  . metoprolol tartrate (LOPRESSOR) tablet 25 mg  25 mg Oral BID Sheikh, Omair Latif, DO      . oxyCODONE-acetaminophen (PERCOCET/ROXICET) 5-325 MG per tablet 1 tablet  1 tablet Oral Q4H PRN Shalhoub, Leafy Half, MD       Or  . morphine 4 MG/ML injection 4 mg  4 mg Intravenous Q4H PRN Shalhoub, Deno Lunger, MD   4 mg at 09/12/20 1252  . ondansetron (ZOFRAN) injection 4 mg  4 mg Intravenous Q6H PRN Shalhoub, 13/08/21, MD   4 mg at 09/12/20 1250    Allergies as of 09/12/2020 - Review Complete 09/12/2020  Allergen Reaction Noted  . Bentyl [dicyclomine] Itching 09/12/2020  . Toradol [ketorolac tromethamine] Itching 09/12/2020  . Zithromax [azithromycin] Nausea And Vomiting 09/12/2020     Review of Systems:    Constitutional: No fever or chills Skin: No rash  Cardiovascular: No chest pain  Respiratory: No  SOB Gastrointestinal: See HPI and otherwise negative Genitourinary: No dysuria  Neurological: No headache, dizziness or syncope Musculoskeletal: No new muscle or joint pain Hematologic: No bruising Psychiatric: No history of depression or anxiety    Physical Exam:  Vital signs in last 24 hours: Temp:  [98 F (36.7 C)-98.7 F (37.1 C)] 98.7 F (37.1 C) (11/08 1449) Pulse Rate:  [60-95] 95 (11/08 1449) Resp:  [12-20] 16 (11/08 1449) BP: (108-172)/(68-113) 155/96 (11/08 1449) SpO2:  [94 %-100 %] 100 % (11/08 1449) Weight:  [61.2 kg] 61.2 kg (11/08 0100) Last BM Date: 09/11/20 General:   Pleasant Caucasian male appears to be in  NAD, Well developed, Well nourished, alert and cooperative Head:  Normocephalic and atraumatic. Eyes:   PEERL, EOMI. No icterus. Conjunctiva pink. Ears:  Normal auditory acuity. Neck:  Supple Throat: Oral cavity and pharynx without inflammation, swelling or lesion.  Lungs: Respirations even and unlabored. Lungs clear to auscultation bilaterally.   No wheezes, crackles, or rhonchi.  Heart: Normal S1, S2. No MRG. Regular rate and rhythm. No peripheral edema, cyanosis or pallor.  Abdomen:  Soft, nondistended, moderate right lower quadrant TTP with involuntary guarding normal bowel sounds. No appreciable masses or hepatomegaly. Rectal:  Not performed.  Msk:  Symmetrical without gross deformities. Peripheral pulses intact.  Extremities:  Without edema, no deformity or joint abnormality.  Neurologic:  Alert and  oriented x4;  grossly normal neurologically.  Skin:   Dry and intact without significant lesions or rashes. Psychiatric: Demonstrates good judgement and reason without abnormal affect or behaviors.   LAB RESULTS: Recent Labs    09/12/20 0121  WBC 12.6*  HGB 11.8*  HCT 37.7*  PLT 228   BMET Recent Labs    09/12/20 0121  NA 139  K 4.2  CL 100  CO2 26  GLUCOSE 129*  BUN 10  CREATININE 0.76  CALCIUM 9.1   LFT Recent Labs    09/12/20 0121   PROT 6.8  ALBUMIN 3.9  AST 28  ALT 21  ALKPHOS 85  BILITOT 0.2*     Impression / Plan:   Impression: 1.  Crohn's with flare: Flare due to the fact the patient is off of his Stelara for the past 3 months, Prednisone 40 mg daily was doing nothing, experiencing abdominal pain, bloody bowel movements and weight loss with nausea and vomiting  Plan: 1.  Discussed with patient how important maintenance medication in this, he is aware but has had a lot going on in his life and has been changing jobs and then loses insurance and loses the ability to get his Stelara.  Tells me he did not feel any better on 40 mg of Prednisone. 2.  Likely start the patient on 60 mg of Solu-Medrol daily 3.  Recommend stool studies to rule out other causes of recent diarrhea 4.  Patient needs to be on Stelara otherwise he will continue to end up in the hospital 5.  Discussed with patient that likely we will not do a colonoscopy given that he is in a full out flare, it would be better done after he is on his maintenance medicine to see if it is helping, but will let Dr. Orvan Falconer know that he wants to have one done. 5.  Please await any further recommendations from Dr. Orvan Falconer later today.  Thank you for your kind consultation, we will continue to follow.  Raymond Huerta Kyree Fedorko  09/12/2020, 3:00 PM

## 2020-09-12 NOTE — ED Notes (Signed)
Report to [facility] 

## 2020-09-12 NOTE — H&P (Signed)
History and Physical    Jorah Hua ZOX:[id number] DOB: 22-Jan-1991 DOA: 09/12/2020  PCP: Pcp, No   Patient coming from: Home  Chief Complaint: Abdominal Pain and Bloody stools   HPI: Raymond Huerta is a 29 y.o. male with medical history significant of 29 year old male with PMHx Crohn's disease (Diagnosed by a GI doc [place]), anxiety and depression, hypertension as well as other comorbidities presenting with abdominal pain, nausea and vomiting despite being started on steroid taper after being seen at [facility] on 11/3.  He has been having ongoing abdominal pain and bloody stools and states that he has been diagnosed with Crohn's disease previously and was on maintenance medication with Stelara but stopped taking it 3 months ago when his job changed.  He continues to have some rectal throbbing and 12 pound weight loss over the last week and a half and has had severe nausea and vomiting.  He has having 5-6 bowel movements a day and he has had some bloody stools.  He feels that he has had more stress in his life especially with his divorce and taking care of his 75 and half-year-old daughter.  Continues to have abdominal pain in the right lower quadrant and was placed on prednisone taper when he was last seen at [facility] on 11 3 and discharged.  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 Patient thought to have persisting Crohn's flare by ER provider. Patient given 125mg  Solumedrol, started on IVF and transferred to [facility] for further management and evaluation.  GI consulted and resuming steroids on the patient.  Of note he has had several ER visits and he went to the ER 1015, 1028, 11 3 and he was seen by telemedicine on 07/07/2019 by [facility].  ED Course: In the ED he had basic blood work done and was given Solu-Medrol, ketamine, morphine and ondansetron and antiemetics.  Of note he denies any imaging done given that  he just had a CT of the abdomen pelvis from 09/07/2020.  COVID-19 testing was negative via PCR.  Review of Systems: As per HPI otherwise all other systems reviewed and negative.   Past Medical History:  Diagnosis Date  . Crohn disease (HCC)   . Kienbock's disease    Past Surgical History:  Procedure Laterality Date  . ABDOMINAL SURGERY    . WRIST SURGERY     SOCIAL HISTORY  reports that he has never smoked. He has never used smokeless tobacco. He reports that he does not drink alcohol and does not use drugs.  Allergies  Allergen Reactions  . Bentyl [Dicyclomine] Itching  . Toradol [Ketorolac Tromethamine] Itching  . Zithromax [Azithromycin] Nausea And Vomiting   FAMILY HISTORY Mom is healthy; Dad has A Fib  Prior to Admission medications   Medication Sig Start Date End Date Taking? Authorizing Provider  dronabinol (MARINOL) 10 MG capsule TAKE 1 CAPSULE BY MOUTH TWICE A DAY BEFORE MEALS 03/25/20  Yes [provider]  ondansetron (ZOFRAN ODT) 4 MG disintegrating tablet 4mg  ODT q4 hours prn nausea/vomit 09/01/20  Yes , Dan, DO  predniSONE (DELTASONE) 10 MG tablet Take 40mg (4 tabs) PO x5 days Take 30mg (3tabs) PO x5 days Take 20mg (2tabs)PO x5 days Take 10mg (1tab)PO x 5 days 09/01/20  Yes Adela Lank, DO  ustekinumab (STELARA) 90 MG/ML SOSY injection Inject into the skin. 03/14/20  Yes [provider]  VYVANSE 60 MG capsule  Take 60 mg by mouth every morning. 01/29/20  Yes [provider]  dicyclomine (BENTYL) 20 MG tablet Take 1 tablet (20 mg total) by mouth 2 (two) times daily. 09/07/20   Khatri, Hina, PA-C  metoCLOPramide (REGLAN) 10 MG tablet Take 1 tablet (10 mg total) by mouth every 6 (six) hours as needed for nausea. 09/07/20   Khatri, Hina, PA-C  metoprolol tartrate (LOPRESSOR) 25 MG tablet Take 25 mg by mouth 2 (two) times daily. 04/07/20   [provider]  morphine (MSIR) 15 MG tablet Take 0.5 tablets (7.5 mg total) by mouth every 4 (four) hours  as needed for severe pain. 09/01/20   Melene Plan, DO  potassium chloride (KLOR-CON) 10 MEQ tablet Take 4 tablets (40 mEq total) by mouth daily for 4 days. 09/07/20 09/11/20  Dietrich Pates, PA-C  promethazine (PHENERGAN) 25 MG suppository Place 1 suppository (25 mg total) rectally every 6 (six) hours as needed for nausea or vomiting. 04/23/20   Alvira Monday, MD   Physical Exam: Vitals:   09/12/20 1245 09/12/20 1300 09/12/20 1315 09/12/20 1330  BP: (!) 149/108 (!) 136/97 (!) 142/94 (!) 129/103  Pulse: 82 80 74 79  Resp: Temp:      TempSrc:      SpO2: 100% 100% 100% 100%  Weight:      Height:       Constitutional: WN/WD Thin Caucasian male in NAD and appears calm but a little uncomfortable Eyes: Lids and conjunctivae normal, sclerae anicteric  ENMT: External Ears, Nose appear normal. Grossly normal hearing.  Neck: Appears normal, supple, no cervical masses, normal ROM, no appreciable thyromegaly; no JVD Respiratory: Slightly diminished to auscultation bilaterally, no wheezing, rales, rhonchi or crackles. Normal respiratory effort and patient is not tachypenic. No accessory muscle use.  Cardiovascular: RRR, no murmurs / rubs / gallops. S1 and S2 auscultated. No extremity edema. Abdomen: Soft, Tender to palpate, non-distended. Bowel sounds positive.  GU: Deferred. Musculoskeletal: No clubbing / cyanosis of digits/nails. No joint deformity upper and lower extremities.  Skin: No rashes, lesions, ulcers on a limited skin evaluation. No induration; Warm and dry.  Neurologic: CN 2-12 grossly intact with no focal deficits. Romberg sign and cerebellar reflexes not assessed.  Psychiatric: Normal judgment and insight. Alert and oriented x 3. Normal mood and appropriate affect.   Labs on Admission: I have personally reviewed following labs and imaging studies  CBC: Recent Labs  Lab 09/07/20 1226 09/12/20 0121  WBC 10.2 12.6*  NEUTROABS 7.0 10.8*  HGB 13.1 11.8*  HCT 41.8 37.7*    MCV 85.8 84.2  PLT 232 228   Basic Metabolic Panel: Recent Labs  Lab 09/07/20 1226 09/12/20 0121  NA 144 139  K 2.8* 4.2  CL 105 100  CO2 28 26  GLUCOSE 102* 129*  BUN 14 10  CREATININE 0.96 0.76  CALCIUM 8.3* 9.1   GFR: Estimated Creatinine Clearance: 119 mL/min (by C-G formula based on SCr of 0.76 mg/dL). Liver Function Tests: Recent Labs  Lab 09/07/20 1226 09/12/20 0121  AST 17 28  ALT 15 21  ALKPHOS 85 85  BILITOT 0.2* 0.2*  PROT 6.9 6.8  ALBUMIN 4.1 3.9   Recent Labs  Lab 09/07/20 1226 09/12/20 0121  LIPASE 41 30   No results for input(s): AMMONIA in the last 168 hours. Coagulation Profile: No results for input(s): INR, PROTIME in the last 168 hours. Cardiac Enzymes: No results for input(s): CKTOTAL, CKMB, CKMBINDEX, TROPONINI in the last  168 hours. BNP (last 3 results) No results for input(s): PROBNP in the last 8760 hours. HbA1C: No results for input(s): HGBA1C in the last 72 hours. CBG: No results for input(s): GLUCAP in the last 168 hours. Lipid Profile: No results for input(s): CHOL, HDL, LDLCALC, TRIG, CHOLHDL, LDLDIRECT in the last 72 hours. Thyroid Function Tests: No results for input(s): TSH, T4TOTAL, FREET4, T3FREE, THYROIDAB in the last 72 hours. Anemia Panel: No results for input(s): VITAMINB12, FOLATE, FERRITIN, TIBC, IRON, RETICCTPCT in the last 72 hours. Urine analysis:    Component Value Date/Time   COLORURINE COLORLESS (A) 09/12/2020 0139   APPEARANCEUR CLEAR 09/12/2020 0139   LABSPEC 1.010 09/12/2020 0139   PHURINE 6.0 09/12/2020 0139   GLUCOSEU NEGATIVE 09/12/2020 0139   HGBUR NEGATIVE 09/12/2020 0139   BILIRUBINUR NEGATIVE 09/12/2020 0139   KETONESUR NEGATIVE 09/12/2020 0139   PROTEINUR NEGATIVE 09/12/2020 0139   NITRITE NEGATIVE 09/12/2020 0139   LEUKOCYTESUR NEGATIVE 09/12/2020 0139   Sepsis Labs: !!!!!!!!!!!!!!!!!!!!!!!!!!!!!!!!!!!!!!!!!!!! @LABRCNTIP (procalcitonin:4,lacticidven:4) ) Recent Results (from the past  240 hour(s))  Respiratory Panel by RT PCR (Flu A&B, Covid) - Nasopharyngeal Swab     Status: None   Collection Time: 09/12/20  3:08 AM   Specimen: Nasopharyngeal Swab  Result Value Ref Range Status   SARS Coronavirus 2 by RT PCR NEGATIVE NEGATIVE Final    Comment: (NOTE) SARS-CoV-2 target nucleic acids are NOT DETECTED.  The SARS-CoV-2 RNA is generally detectable in upper respiratoy specimens during the acute phase of infection. The lowest concentration of SARS-CoV-2 viral copies this assay can detect is 131 copies/mL. A negative result does not preclude SARS-Cov-2 infection and should not be used as the sole basis for treatment or other patient management decisions. A negative result may occur with  improper specimen collection/handling, submission of specimen other than nasopharyngeal swab, presence of viral mutation(s) within the areas targeted by this assay, and inadequate number of viral copies (<131 copies/mL). A negative result must be combined with clinical observations, patient history, and epidemiological information. The expected result is Negative.  Fact Sheet for Patients:  13/08/21  Fact Sheet for Healthcare Providers:  https://www.moore.com/  This test is no t yet approved or cleared by the https://www.young.biz/ FDA and  has been authorized for detection and/or diagnosis of SARS-CoV-2 by FDA under an Emergency Use Authorization (EUA). This EUA will remain  in effect (meaning this test can be used) for the duration of the COVID-19 declaration under Section 564(b)(1) of the Act, 21 U.S.C. section 360bbb-3(b)(1), unless the authorization is terminated or revoked sooner.     Influenza A by PCR NEGATIVE NEGATIVE Final   Influenza B by PCR NEGATIVE NEGATIVE Final    Comment: (NOTE) The Xpert Xpress SARS-CoV-2/FLU/RSV assay is intended as an aid in  the diagnosis of influenza from Nasopharyngeal swab specimens and  should  not be used as a sole basis for treatment. Nasal washings and  aspirates are unacceptable for Xpert Xpress SARS-CoV-2/FLU/RSV  testing.  Fact Sheet for Patients: [place]  Fact Sheet for Healthcare Providers: https://www.moore.com/  This test is not yet approved or cleared by the https://www.young.biz/ FDA and  has been authorized for detection and/or diagnosis of SARS-CoV-2 by  FDA under an Emergency Use Authorization (EUA). This EUA will remain  in effect (meaning this test can be used) for the duration of the  Covid-19 declaration under Section 564(b)(1) of the Act, 21  U.S.C. section 360bbb-3(b)(1), unless the authorization is  terminated or revoked. Performed at [facility],  [address], [place], [place] [address]      Radiological Exams on Admission: No results found.  EKG: No EKG done in the ED; Will order one for Admission   Assessment/Plan Active Problems:   Acute Crohn's disease (HCC)  Acute Flare of Crohn's Disease -Was accepted by Dr. Leafy Half overnight and admitted to Observation Med Surge -Recently been to the ER on 10/28 and 11/3 for similar complaints -Continues to have RLQ and Umbilical Pain 8/10 associated with N/V and Bloody Diarrhea; Has a Hemicolectomy -Recent CT Abd/Pelvis 09/07/20 at [facility] showed "Signs of probable ileocecal resection and ileocolonic anastomosis in the RIGHT lower quadrant. No acute gastrointestinal process.Post cholecystectomy no biliary duct dilation. Stomach with moderate to marked distension but without perigastric stranding. Correlate with any current symptoms of impaired gastric emptying. This may simply represent recent ingestion of a large meal." -Felt as if he had a persistent Crohn's Flare -Give IV Solumedrol 125 mg x1 in the ED this AM -C/w Pain Control and given 1 mg IV Dilaudid, 4 mg IV Morphine x1 and IV Ketamine 18 mg x1 -C/w Oxycodone-Acetaminophen 1 tab po q4hprn  Moderate Pain and  Was started on IV Morphine 4 mg IV q4hprn Severe Pain but he feels that it is ineffective so will start him on IV Fentanyl 25 mcg q2hprn Severe Pain -C/w Supportive Care and given IV Ondansetron 4 mg x2 in the ED and  Promethazine 12.5 mg IV x1; C/w Ondansetron 4 mg IV q5hprn Nausea/Vomiting  -Given IVF Hydration and was given 1 Liter NS bolus, Started on LR at 125 mL/hr and will continue -Gastroenterology consulted for further evaluation and recommendations  -Takes Stelara 90 mg usually as maintenance for Immunosuppression and was a Prednisone Taper of 40 mg x5 days, then 30 mg x5 days, 20 mg x5 days and then 10 mg x 5 days -Also has Metaclopradimed 10 mg po q6hprn Nausea, Po 4 mg Zofran ODT q4hprn Nausea/Vomiting, and Phenergan 25 mg Rectal Suppository as an outpatient which has been held -Was given MSIR 7.5 mg po q4hprn Severe Pain as an outpatient which has been held -CRP was 0.6 -C/w Clear Liquid Diet for now -GI planning on starting IV Solumedrol 60 mg Daily  -Further Care and Recommendations per Gastroenterology   Essential Hypertension -C/w Metoprolol 25 mg po BID -Will add IV Hydralazine 10 mg q6hprn for SBP>160 or DBP>100  Leukocytosis -Patient's WBC was 12.6 on Admission earlier this AM and in the setting of Steroid Demargination as he was placed on a Prednisone Taper -Further Steroids per GI Recc's -WBC has gone from 6.1 -> 10.2 -> 12.6 -Continue to Monitor for S/Sx of Infection -Repeat CBC in the AM   Anxiety and Depression -C/w Vyvanse but reduce dose to 40 mg po Daily -Takes Lorazepam 1 mg po BID prn as an outpatient but will need to verify   Normocytic Anemia  -Paitent's Hgb/Hct went from 12.2/38.4 -> 13.1/41.8 -> 11.8/37.7 -Check Anemia Panel in the AM -Continue to Monitor for S/Sx of Bleeding; Had some GI Bleeding in the setting of Crohns Flare -Repeat CBC in the AM  Hyperglycemia -In the setting of Steroid Demargination -will need to R/o  Diabetes -Check HbA1c  -Blood Sugars ranging from 99-129 on Daily CMP's -Continue to Monitor Blood Sugars Carefully and if Necessary will place on Sensitive Novolog SSI AC  Hx of Kienbck Disease -Stable  DVT prophylaxis: SCDs given GI Bleeding Code Status: FULL CODE Family Communication: No family present at bedside  Disposition Plan: Pending further clinical improvement and clearance by Gastroenterology Consults called: Gastroenterology  Admission status: Obs Med-Surge  Severity of Illness: The appropriate patient status for this patient is OBSERVATION. Observation status is judged to be reasonable and necessary in order to provide the required intensity of service to ensure the patient's safety. The patient's presenting symptoms, physical exam findings, and initial radiographic and laboratory data in the context of their medical condition is felt to place them at decreased risk for further clinical deterioration. Furthermore, it is anticipated that the patient will be medically stable for discharge from the hospital within 2 midnights of admission. The following factors support the patient status of observation.   " The patient's presenting symptoms include Abdominal Pain, Nausea, Vomiting, Blood Stools. " The physical exam findings include Abdominal Tenderness. " The initial radiographic and laboratory data are remarkable for a Leukocytosis .  Status is: Observation  The patient remains OBS appropriate and will d/c before 2 midnights.  Dispo: The patient is from: Home              Anticipated d/c is to: Home              Anticipated d/c date is:1- 2 days              Patient currently is not medically stable to d/c.   Merlene Laughter, D.O. [facility] PAGER is on AMION  If 7PM-7AM, please contact night-coverage [email or web address]  09/12/2020, 2:42 PM

## 2020-09-12 NOTE — ED Notes (Signed)
Pt c/o pain and nausea, pt [id number]

## 2020-09-12 NOTE — ED Provider Notes (Signed)
[facility] EMERGENCY DEPARTMENT Provider Note   CSN: [id number] Arrival date & time: 09/12/20  0044     History Chief Complaint  Patient presents with  . Abdominal Pain    Raymond Huerta is a 29 y.o. male.  HPI     This is a 29 year old male with a history of Crohn's disease with history of hemicolectomy who presents with ongoing abdominal pain and bloody stools.  Patient reports that he was seen and evaluated last week for the same.  He states progressive and ongoing central periumbilical abdominal pain and right lower quadrant pain.  It is crampy in nature.  It is nonradiating.  He rates his pain 8 out of 10.  He has not been taking any pain medication at home because "I do not have any nausea medication and the pain medication makes me nauseated."  He reports he has had difficulty keeping anything down and has food aversion.  No fevers.  He reports ongoing bloody stools.  He is currently on 30 mg of prednisone for presumed Crohn's flare.  He is also on Stelara for maintenance immunosuppression.  Patient reports that his primary gastroenterologist is in [facility].  He is in [facility] caring for a sick relative.  Chart reviewed.  He has had 4 emergency room visits since October 15 for the same.  He was started on a prednisone taper on October 28th for presumed Crohn's flare.  He has not been able to follow-up with his gastroenterologist.  He had CT imaging on 10/16 and 11/3.  Past Medical History:  Diagnosis Date  . Crohn disease (HCC)   . [place]'s disease     There are no problems to display for this patient.   Past Surgical History:  Procedure Laterality Date  . ABDOMINAL SURGERY    . WRIST SURGERY         No family history on file.  Social History   Tobacco Use  . Smoking status: Never Smoker  . Smokeless tobacco: Never Used  Substance Use Topics  . Alcohol use: Never  . Drug use: Never    Home Medications Prior to Admission  medications   Medication Sig Start Date End Date Taking? Authorizing Provider  dronabinol (MARINOL) 10 MG capsule TAKE 1 CAPSULE BY MOUTH TWICE A DAY BEFORE MEALS 03/25/20  Yes [provider]  ondansetron (ZOFRAN ODT) 4 MG disintegrating tablet 4mg  ODT q4 hours prn nausea/vomit 09/01/20  Yes 09/03/20, Dan, DO  predniSONE (DELTASONE) 10 MG tablet Take 40mg (4 tabs) PO x5 days Take 30mg (3tabs) PO x5 days Take 20mg (2tabs)PO x5 days Take 10mg (1tab)PO x 5 days 09/01/20  Yes , DO  ustekinumab (STELARA) 90 MG/ML SOSY injection Inject into the skin. 03/14/20  Yes [provider]  VYVANSE 60 MG capsule Take 60 mg by mouth every morning. 01/29/20  Yes [provider]  dicyclomine (BENTYL) 20 MG tablet Take 1 tablet (20 mg total) by mouth 2 (two) times daily. 09/07/20   Khatri, Hina, PA-C  metoCLOPramide (REGLAN) 10 MG tablet Take 1 tablet (10 mg total) by mouth every 6 (six) hours as needed for nausea. 09/07/20   Khatri, Hina, PA-C  metoprolol tartrate (LOPRESSOR) 25 MG tablet Take 25 mg by mouth 2 (two) times daily. 04/07/20   [provider]  morphine (MSIR) 15 MG tablet Take 0.5 tablets (7.5 mg total) by mouth every 4 (four) hours as needed for severe pain. 09/01/20   01/31/20, DO  potassium chloride (KLOR-CON) 10 MEQ  tablet Take 4 tablets (40 mEq total) by mouth daily for 4 days. 09/07/20 09/11/20  Dietrich Pates, PA-C  promethazine (PHENERGAN) 25 MG suppository Place 1 suppository (25 mg total) rectally every 6 (six) hours as needed for nausea or vomiting. 04/23/20   Alvira Monday, MD    Allergies    Bentyl [dicyclomine], Toradol [ketorolac tromethamine], and Zithromax [azithromycin]  Review of Systems   Review of Systems  Constitutional: Negative for fever.  Respiratory: Negative for shortness of breath.   Cardiovascular: Negative for chest pain.  Gastrointestinal: Positive for abdominal pain, blood in stool, nausea and vomiting. Negative for constipation and  diarrhea.  Genitourinary: Negative for dysuria.  All other systems reviewed and are negative.   Physical Exam Updated Vital Signs BP (!) 172/113 (BP Location: Right Arm)   Pulse 81   Temp 98.4 F (36.9 C)   Resp 18   Ht 1.803 m (5\' 11" )   Wt 61.2 kg   SpO2 100%   BMI 18.83 kg/m   Physical Exam Vitals and nursing note reviewed.  Constitutional:      Appearance: He is well-developed. He is not ill-appearing.  HENT:     Head: Normocephalic and atraumatic.     Mouth/Throat:     Mouth: Mucous membranes are moist.  Eyes:     Pupils: Pupils are equal, round, and reactive to light.  Cardiovascular:     Rate and Rhythm: Normal rate and regular rhythm.     Heart sounds: Normal heart sounds. No murmur heard.   Pulmonary:     Effort: Pulmonary effort is normal. No respiratory distress.     Breath sounds: Normal breath sounds. No wheezing.  Abdominal:     General: Bowel sounds are normal.     Palpations: Abdomen is soft.     Tenderness: There is abdominal tenderness in the right lower quadrant and periumbilical area. There is no guarding or rebound.  Genitourinary:    Comments: GU deferred. Musculoskeletal:     Cervical back: Neck supple.  Lymphadenopathy:     Cervical: No cervical adenopathy.  Skin:    General: Skin is warm and dry.  Neurological:     Mental Status: He is alert and oriented to person, place, and time.  Psychiatric:        Mood and Affect: Mood normal.     ED Results / Procedures / Treatments   Labs (all labs ordered are listed, but only abnormal results are displayed) Labs Reviewed  CBC WITH DIFFERENTIAL/PLATELET - Abnormal; Notable for the following components:      Result Value   WBC 12.6 (*)    Hemoglobin 11.8 (*)    HCT 37.7 (*)    RDW 16.5 (*)    Neutro Abs 10.8 (*)    Abs Immature Granulocytes 0.38 (*)    All other components within normal limits  COMPREHENSIVE METABOLIC PANEL - Abnormal; Notable for the following components:   Glucose,  Bld 129 (*)    Total Bilirubin 0.2 (*)    All other components within normal limits  URINALYSIS, ROUTINE W REFLEX MICROSCOPIC - Abnormal; Notable for the following components:   Color, Urine COLORLESS (*)    All other components within normal limits  RESPIRATORY PANEL BY RT PCR (FLU A&B, COVID)  LIPASE, BLOOD  C-REACTIVE PROTEIN    EKG None  Radiology No results found.  Procedures Procedures (including critical care time)  Medications Ordered in ED Medications  0.9 %  sodium chloride infusion (has no administration  in time range)  methylPREDNISolone sodium succinate (SOLU-MEDROL) 125 mg/2 mL injection 125 mg (has no administration in time range)  sodium chloride 0.9 % bolus 1,000 mL (1,000 mLs Intravenous New Bag/Given 09/12/20 0138)  morphine 4 MG/ML injection 4 mg (4 mg Intravenous Given 09/12/20 0133)  ondansetron (ZOFRAN) injection 4 mg (4 mg Intravenous Given 09/12/20 0133)  HYDROmorphone (DILAUDID) injection 1 mg (1 mg Intravenous Given 09/12/20 0214)    ED Course  I have reviewed the triage vital signs and the nursing notes.  Pertinent labs & imaging results that were available during my care of the patient were reviewed by me and considered in my medical decision making (see chart for details).  Clinical Course as of Sep 12 254  [facility] Sep 12, 2020  0249 On recheck, patient continues to endorse pain.  He was received 4 mg of morphine and 1 mg of Dilaudid with minimal improvement.  His lab work is reviewed.  No significant evidence of dehydration.  Mild leukocytosis but he is currently on prednisone.  Hemoglobin is 11.8.  Last hemoglobin 13.1.  Baseline between 11 and 13.  He is appropriately on prednisone for presumed flare and titrated down 3 days ago.  This does correlate when his symptoms worsen.  Patient does not feel he will be able to adequately control his symptoms at home.   [CH]    Clinical Course User Index [CH] Jarica Plass, Mayer Masker, MD   MDM  Rules/Calculators/A&P                           Patient presents with abdominal pain.  Reports that this pain is consistent with prior Crohn's flares.  He is overall nontoxic.  Vital signs notable for blood pressure 172/113.  He has a tender abdomen but no signs of acute peritonitis.  He is afebrile.  He is currently on a prednisone taper.  Considerations include but not limited to, ongoing Crohn's flare, SBO, enteritis or gastritis.  Patient was given fluids, pain and nausea medication.  Lab work obtained.  Slightly elevated white count at 12.  This is in the setting of a prednisone taper.  Hemoglobin is 11.8.  Last hemoglobin 13.1.  Baseline between 11 and 13.  He has had 2 recent CT scans in the last 6 weeks 1 of which showed some evidence of active Crohn's disease on 10/16 and a second CT on 11/3 which just showed changes related to his known resection.  Given that he has no signs of peritonitis on exam, would like to avoid repeat imaging if at all possible.  Unfortunately, patient has ongoing pain refractory to narcotic pain medication.  I have reviewed his [place] controlled substances database.  He does unfortunately have multiple providers and multiple prescriptions the last of which he had to narcotic prescriptions last week.  This does raise some concern; however, he also has known active disease.  I discussed options with patient including ongoing pain control and close outpatient follow-up with primary physician for titration of his steroids versus observation admission for pain control and GI evaluation.  Patient reports ongoing pain which is significant.  Plan for admission and have GI weigh in on the patient for ongoing recommendations.  He was given 1.5 Solu-Medrol.  CRP was added to his work-up.  Will discuss with admitting hospitalist.  COVID testing pending.  Final Clinical Impression(s) / ED Diagnoses Final diagnoses:  Right lower quadrant abdominal pain  Crohn's disease with  rectal bleeding, unspecified gastrointestinal tract location [facility])    Rx / DC Orders ED Discharge Orders    None       Seleste Tallman, Mayer Masker, MD 09/12/20 0300

## 2020-09-12 NOTE — ED Notes (Signed)
Pt transported by [facility] to [facility].

## 2020-09-13 DIAGNOSIS — F32A Depression, unspecified: Secondary | ICD-10-CM | POA: Diagnosis present

## 2020-09-13 DIAGNOSIS — F419 Anxiety disorder, unspecified: Secondary | ICD-10-CM

## 2020-09-13 DIAGNOSIS — R739 Hyperglycemia, unspecified: Secondary | ICD-10-CM | POA: Diagnosis present

## 2020-09-13 DIAGNOSIS — D649 Anemia, unspecified: Secondary | ICD-10-CM | POA: Diagnosis present

## 2020-09-13 DIAGNOSIS — D72829 Elevated white blood cell count, unspecified: Secondary | ICD-10-CM | POA: Diagnosis present

## 2020-09-13 DIAGNOSIS — I10 Essential (primary) hypertension: Secondary | ICD-10-CM | POA: Diagnosis present

## 2020-09-13 DIAGNOSIS — E538 Deficiency of other specified B group vitamins: Secondary | ICD-10-CM | POA: Diagnosis present

## 2020-09-13 LAB — CBC WITH DIFFERENTIAL/PLATELET
Abs Immature Granulocytes: 0.2 10*3/uL — ABNORMAL HIGH (ref 0.00–0.07)
Basophils Absolute: 0 10*3/uL (ref 0.0–0.1)
Basophils Relative: 0 %
Eosinophils Absolute: 0 10*3/uL (ref 0.0–0.5)
Eosinophils Relative: 0 %
HCT: 34.3 % — ABNORMAL LOW (ref 39.0–52.0)
Hemoglobin: 10.8 g/dL — ABNORMAL LOW (ref 13.0–17.0)
Immature Granulocytes: 1 %
Lymphocytes Relative: 9 %
Lymphs Abs: 1.8 10*3/uL (ref 0.7–4.0)
MCH: 26.5 pg (ref 26.0–34.0)
MCHC: 31.5 g/dL (ref 30.0–36.0)
MCV: 84.1 fL (ref 80.0–100.0)
Monocytes Absolute: 1.4 10*3/uL — ABNORMAL HIGH (ref 0.1–1.0)
Monocytes Relative: 7 %
Neutro Abs: 16.1 10*3/uL — ABNORMAL HIGH (ref 1.7–7.7)
Neutrophils Relative %: 83 %
Platelets: 242 10*3/uL (ref 150–400)
RBC: 4.08 MIL/uL — ABNORMAL LOW (ref 4.22–5.81)
RDW: 16.9 % — ABNORMAL HIGH (ref 11.5–15.5)
WBC: 19.6 10*3/uL — ABNORMAL HIGH (ref 4.0–10.5)
nRBC: 0 % (ref 0.0–0.2)

## 2020-09-13 LAB — RETICULOCYTES
Immature Retic Fract: 11.5 % (ref 2.3–15.9)
RBC.: 4.19 MIL/uL — ABNORMAL LOW (ref 4.22–5.81)
Retic Count, Absolute: 70.8 10*3/uL (ref 19.0–186.0)
Retic Ct Pct: 1.7 % (ref 0.4–3.1)

## 2020-09-13 LAB — COMPREHENSIVE METABOLIC PANEL
ALT: 17 U/L (ref 0–44)
AST: 19 U/L (ref 15–41)
Albumin: 3.4 g/dL — ABNORMAL LOW (ref 3.5–5.0)
Alkaline Phosphatase: 62 U/L (ref 38–126)
Anion gap: 9 (ref 5–15)
BUN: 15 mg/dL (ref 6–20)
CO2: 24 mmol/L (ref 22–32)
Calcium: 8.5 mg/dL — ABNORMAL LOW (ref 8.9–10.3)
Chloride: 103 mmol/L (ref 98–111)
Creatinine, Ser: 0.9 mg/dL (ref 0.61–1.24)
GFR, Estimated: >60 mL/min (ref >60)
Glucose, Bld: 105 mg/dL — ABNORMAL HIGH (ref 70–99)
Potassium: 4 mmol/L (ref 3.5–5.1)
Sodium: 136 mmol/L (ref 135–145)
Total Bilirubin: 0.6 mg/dL (ref 0.3–1.2)
Total Protein: 5.5 g/dL — ABNORMAL LOW (ref 6.5–8.1)

## 2020-09-13 LAB — FERRITIN: Ferritin: 11 ng/mL — ABNORMAL LOW (ref 24–336)

## 2020-09-13 LAB — SEDIMENTATION RATE: Sed Rate: 5 mm/hr (ref 0–16)

## 2020-09-13 LAB — IRON AND TIBC
Iron: 96 ug/dL (ref 45–182)
Saturation Ratios: 24 % (ref 17.9–39.5)
TIBC: 392 ug/dL (ref 250–450)
UIBC: 296 ug/dL

## 2020-09-13 LAB — HEMOGLOBIN A1C
Hgb A1c MFr Bld: 5.9 % — ABNORMAL HIGH (ref 4.8–5.6)
Mean Plasma Glucose: 122.63 mg/dL

## 2020-09-13 LAB — MAGNESIUM: Magnesium: 2.3 mg/dL (ref 1.7–2.4)

## 2020-09-13 LAB — TSH: TSH: 1.861 u[IU]/mL (ref 0.350–4.500)

## 2020-09-13 LAB — VITAMIN B12: Vitamin B-12: 280 pg/mL (ref 180–914)

## 2020-09-13 LAB — FOLATE: Folate: 4.7 ng/mL — ABNORMAL LOW (ref >5.9)

## 2020-09-13 LAB — HIV ANTIBODY (ROUTINE TESTING W REFLEX): HIV Screen 4th Generation wRfx: NONREACTIVE

## 2020-09-13 LAB — PHOSPHORUS: Phosphorus: 3.8 mg/dL (ref 2.5–4.6)

## 2020-09-13 MED ORDER — HYDROMORPHONE HCL 1 MG/ML IJ SOLN
1.0000 mg | INTRAMUSCULAR | Status: DC | PRN
  Administered 2020-09-13 – 2020-09-16 (×16): 1 mg via INTRAVENOUS
  Filled 2020-09-13 (×15): qty 1

## 2020-09-13 MED ORDER — DICYCLOMINE HCL 10 MG PO CAPS
10.0000 mg | ORAL_CAPSULE | Freq: Three times a day (TID) | ORAL | Status: DC
  Administered 2020-09-13 – 2020-09-16 (×9): 10 mg via ORAL
  Filled 2020-09-13 (×9): qty 1

## 2020-09-13 MED ORDER — FOLIC ACID 1 MG PO TABS
1.0000 mg | ORAL_TABLET | Freq: Every day | ORAL | Status: DC
  Administered 2020-09-13 – 2020-09-16 (×4): 1 mg via ORAL
  Filled 2020-09-13 (×4): qty 1

## 2020-09-13 MED ORDER — CYANOCOBALAMIN 1000 MCG/ML IJ SOLN
1000.0000 ug | Freq: Every day | INTRAMUSCULAR | Status: DC
  Administered 2020-09-13 – 2020-09-15 (×3): 1000 ug via SUBCUTANEOUS
  Filled 2020-09-13 (×4): qty 1

## 2020-09-13 MED ORDER — HYDROMORPHONE HCL 1 MG/ML IJ SOLN: 1.0000 mg | INTRAMUSCULAR | Status: DC | PRN

## 2020-09-13 MED ORDER — OXYCODONE-ACETAMINOPHEN 5-325 MG PO TABS
1.0000 | ORAL_TABLET | ORAL | Status: DC | PRN
  Administered 2020-09-13 – 2020-09-16 (×15): 2 via ORAL
  Filled 2020-09-13 (×15): qty 2

## 2020-09-13 MED ORDER — ESCITALOPRAM OXALATE 10 MG PO TABS
10.0000 mg | ORAL_TABLET | Freq: Every day | ORAL | Status: DC
  Administered 2020-09-13 – 2020-09-16 (×4): 10 mg via ORAL
  Filled 2020-09-13 (×4): qty 1

## 2020-09-13 MED ORDER — AMPHETAMINE-DEXTROAMPHET ER 5 MG PO CP24
15.0000 mg | ORAL_CAPSULE | Freq: Every day | ORAL | Status: DC
  Administered 2020-09-13 – 2020-09-15 (×3): 15 mg via ORAL
  Filled 2020-09-13 (×4): qty 3

## 2020-09-13 MED ORDER — NICOTINE 21 MG/24HR TD PT24
21.0000 mg | MEDICATED_PATCH | Freq: Every day | TRANSDERMAL | Status: DC
  Administered 2020-09-13 – 2020-09-16 (×4): 21 mg via TRANSDERMAL
  Filled 2020-09-13 (×4): qty 1

## 2020-09-13 NOTE — Progress Notes (Signed)
PROGRESS NOTE    Raymond Huerta  MVH:[id number] DOB: 01/04/1991 DOA: 09/12/2020 PCP: Pcp, No (Confirm with patient/family/NH records and if not entered, this HAS to be entered at [facility] point of entry. "No PCP" if truly none.)   Chief Complaint  Patient presents with  . Abdominal Pain    Brief Narrative:  HPI per Dr. Gertie Baron Throne is a 29 y.o. male with medical history significant of 29 year old male with PMHx Crohn's disease (Diagnosed by a GI doc in [place]), anxiety and depression, hypertension as well as other comorbidities presenting with abdominal pain, nausea and vomiting despite being started on steroid taper after being seen at [facility] on 11/3.  He has been having ongoing abdominal pain and bloody stools and states that he has been diagnosed with Crohn's disease previously and was on maintenance medication with Stelara but stopped taking it 3 months ago when his job changed.  He continues to have some rectal throbbing and 12 pound weight loss over the last week and a half and has had severe nausea and vomiting.  He has having 5-6 bowel movements a day and he has had some bloody stools.  He feels that he has had more stress in his life especially with his divorce and taking care of his 35 and half-year-old daughter.  Continues to have abdominal pain in the right lower quadrant and was placed on prednisone taper when he was last seen at [facility] on 11 3 and discharged.  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 Patient thought to have persisting Crohn's flare by ER provider. Patient given 125mg  Solumedrol, started on IVF and transferred to [facility] for further management and evaluation.  GI consulted and resuming steroids on the patient.  Of note he has had several ER visits and he went to the ER 1015, 1028, 11 3 and he was seen by telemedicine on 07/07/2019 by [facility].  ED Course: In the ED he had basic  blood work done and was given Solu-Medrol, ketamine, morphine and ondansetron and antiemetics.  Of note he denies any imaging done given that he just had a CT of the abdomen pelvis from 09/07/2020.  COVID-19 testing was negative via PCR.  Assessment & Plan:   Principal Problem:   Acute Crohn's disease (HCC) Active Problems:   Right lower quadrant abdominal pain   Folate deficiency   Low vitamin B12 level   Normocytic anemia   Anxiety and depression   Essential hypertension   Leukocytosis   Hyperglycemia  #1 acute Crohn's flare Patient presenting with nausea, vomiting, abdominal pain, bloody stools.  Patient noted to have recently been seen in the ED 1028 and 11 3 with similar complaints.  Patient prior history of hemicolectomy.  CT abdomen and pelvis done from 09/07/2020 with probable ileocecal resection and ileocolonic anastomosis in the right lower quadrant, no acute GI process, post cholecystectomy with no biliary duct dilatation, stomach with moderate to marked distention but without perigastric stranding.  Patient states pain currently being managed on current pain medication.  Still with some nausea.  No emesis.  Denies any further bloody stools.  Patient has been seen in consultation by GI who ordered a sed rate, CRP, fecal Cal protectant, GI pathogen panel, C. difficile PCR which is pending.  P at 0.6.  Sed rate at 5.  Folate levels of 4.7.  Will place on Bentyl  10 mg 3 times daily.  Continue Solu-Medrol 60 mg IV daily as recommended per GI.  Continue antiemetics.  Discontinue fentanyl and placed on IV Dilaudid as needed severe pain.  Continue Percocet as needed for moderate pain./Requested colonoscopy for staging which will defer to GI.  GI following and appreciate input and recommendations.  2.  Folate deficiency Likely secondary to problem #1.  Placed on folic acid 1 mg daily.  Outpatient follow-up.  3.  Hypertension Stable.  Continue home regimen metoprolol 25 mg twice daily.  4.   Leukocytosis Likely reactive leukocytosis in the setting of steroids.  Patient with no signs or symptoms of infection.  Urinalysis done on admission nitrite negative leukocytes negative.  Patient with no respiratory symptoms.  Patient currently afebrile.  WBC currently at 19.6.  Monitor on steroids.  Hold off on antibiotics at this time.  5.  Depression/anxiety Continue Vyvanse at reduced dose of 40 mg daily.  Could likely uptitrate back to home dose in the next 1 to 2 days.  Resume home regimen Adderall.  Resume home regimen Lexapro.  Continue Ativan twice daily as needed.  6.  Hyperglycemia Likely secondary to steroids.  Hemoglobin A1c 5.9.  Blood glucose level on labs this morning at 105.  7.  Normocytic anemia/low vitamin B12 Patient had presented with some rectal bleeding secondary to problem #1.  Patient denies any further rectal bleeding.  Anemia panel with iron level of 96, TIBC of 392, ferritin of 11, folate of 4.7, vitamin B12 of 280.  Folic acid 1 mg daily.  Hemoglobin currently at 10.8.  Will place on vitamin B12 subcu daily.  Follow H&H.  Transfusion threshold hemoglobin < 7.  GI following.    DVT prophylaxis: SCDs Code Status: Full Family Communication: Updated patient and significant other at bedside. Disposition:   Status is: Inpatient    Dispo: The patient is from: Home              Anticipated d/c is to: Home              Anticipated d/c date is: 2 to 3 days.              Patient currently on IV Solu-Medrol, being managed for Crohn's flare, not stable for discharge.       Consultants:   Gastroenterology: Dr. Orvan Falconer 09/12/2020  Procedures:  None  Antimicrobials:   None   Subjective: Sitting up in bed.  Still with lower abdominal pain.  States pain medication usually helps with abdominal pain for an hour and then subsequently returns.  Still with nausea and emesis.  No emesis this morning.  Able to tolerate some clear liquids per patient.  No bloody bowel  movements yet.  Objective: Vitals:   09/12/20 2158 09/13/20 0218 09/13/20 0600 09/13/20 1408  BP: (!) 148/92 (!) 137/97 131/67 (!) 139/95  Pulse: (!) 105 73 85 88  Resp: Temp: 98.7 F (37.1 C) 97.9 F (36.6 C) 98 F (36.7 C) 97.7 F (36.5 C)  TempSrc: Oral Oral Oral Oral  SpO2: 99% 100% 100% 100%  Weight:      Height:        Intake/Output Summary (Last 24 hours) at 09/13/2020 1537 Last data filed at 09/13/2020 1400 Gross per 24 hour  Intake 2424.08 ml  Output 0 ml  Net 2424.08 ml   Filed Weights   09/12/20 0100  Weight: 61.2 kg    Examination:  General exam: Appears calm  and comfortable  Respiratory system: Clear to auscultation. Respiratory effort normal. Cardiovascular system: S1 & S2 heard, RRR. No JVD, murmurs, rubs, gallops or clicks. No pedal edema. Gastrointestinal system: Abdomen is nondistended, soft and tender to palpation in the left lower quadrant, suprapubic, right lower quadrant.  Positive bowel sounds.  Central nervous system: Alert and oriented. No focal neurological deficits. Extremities: Symmetric 5 x 5 power. Skin: No rashes, lesions or ulcers Psychiatry: Judgement and insight appear normal. Mood & affect appropriate.     Data Reviewed: I have personally reviewed following labs and imaging studies  CBC: Recent Labs  Lab 09/07/20 1226 09/12/20 0121 09/13/20 0532  WBC 10.2 12.6* 19.6*  NEUTROABS 7.0 10.8* 16.1*  HGB 13.1 11.8* 10.8*  HCT 41.8 37.7* 34.3*  MCV 85.8 84.2 84.1  PLT 232 228 242    Basic Metabolic Panel: Recent Labs  Lab 09/07/20 1226 09/12/20 0121 09/13/20 0532  NA 144 139 136  K 2.8* 4.2 4.0  CL 105 100 103  CO2 28 26 24   GLUCOSE 102* 129* 105*  BUN 14 10 15   CREATININE 0.96 0.76 0.90  CALCIUM 8.3* 9.1 8.5*  MG  --   --  2.3  PHOS  --   --  3.8    GFR: Estimated Creatinine Clearance: 105.8 mL/min (by C-G formula based on SCr of 0.9 mg/dL).  Liver Function Tests: Recent Labs  Lab 09/07/20 1226  09/12/20 0121 09/13/20 0532  AST 17 28 19   ALT 15 21 17   ALKPHOS 85 85 62  BILITOT 0.2* 0.2* 0.6  PROT 6.9 6.8 5.5*  ALBUMIN 4.1 3.9 3.4*    CBG: No results for input(s): GLUCAP in the last 168 hours.   Recent Results (from the past 240 hour(s))  Respiratory Panel by RT PCR (Flu A&B, Covid) - Nasopharyngeal Swab     Status: None   Collection Time: 09/12/20  3:08 AM   Specimen: Nasopharyngeal Swab  Result Value Ref Range Status   SARS Coronavirus 2 by RT PCR NEGATIVE NEGATIVE Final    Comment: (NOTE) SARS-CoV-2 target nucleic acids are NOT DETECTED.  The SARS-CoV-2 RNA is generally detectable in upper respiratoy specimens during the acute phase of infection. The lowest concentration of SARS-CoV-2 viral copies this assay can detect is 131 copies/mL. A negative result does not preclude SARS-Cov-2 infection and should not be used as the sole basis for treatment or other patient management decisions. A negative result may occur with  improper specimen collection/handling, submission of specimen other than nasopharyngeal swab, presence of viral mutation(s) within the areas targeted by this assay, and inadequate number of viral copies (<131 copies/mL). A negative result must be combined with clinical observations, patient history, and epidemiological information. The expected result is Negative.  Fact Sheet for Patients:  13/09/21  Fact Sheet for Healthcare Providers:   This test is no t yet approved or cleared by the FDA and  has been authorized for detection and/or diagnosis of SARS-CoV-2 by FDA under an Emergency Use Authorization (EUA). This EUA will remain  in effect (meaning this test can be used) for the duration of the COVID-19 declaration under Section 564(b)(1) of the Act, 21 U.S.C. section 360bbb-3(b)(1), unless the authorization is terminated or revoked sooner.      Influenza A by PCR NEGATIVE NEGATIVE Final   Influenza B by PCR NEGATIVE NEGATIVE Final    Comment: (NOTE) The Xpert Xpress SARS-CoV-2/FLU/RSV assay is intended as an aid in  the diagnosis of influenza  from Nasopharyngeal swab specimens and  should not be used as a sole basis for treatment. Nasal washings and  aspirates are unacceptable for Xpert Xpress SARS-CoV-2/FLU/RSV  testing.  Fact Sheet for Patients: https://www.moore.com/  Fact Sheet for Healthcare Providers: https://www.young.biz/  This test is not yet approved or cleared by the [place] FDA and  has been authorized for detection and/or diagnosis of SARS-CoV-2 by  FDA under an Emergency Use Authorization (EUA). This EUA will remain  in effect (meaning this test can be used) for the duration of the  Covid-19 declaration under Section 564(b)(1) of the Act, 21  U.S.C. section 360bbb-3(b)(1), unless the authorization is  terminated or revoked. Performed at [facility], [address], [place], [place] [address]          Radiology Studies: No results found.      Scheduled Meds: . amphetamine-dextroamphetamine  15 mg Oral QHS  . dicyclomine  10 mg Oral TID AC  . escitalopram  10 mg Oral Daily  . folic acid  1 mg Oral Daily  . lisdexamfetamine  40 mg Oral QAC breakfast  . methylPREDNISolone (SOLU-MEDROL) injection  60 mg Intravenous Daily  . metoprolol tartrate  25 mg Oral BID   Continuous Infusions: . lactated ringers 125 mL/hr at 09/12/20 2254     LOS: 0 days    Time spent: 35 minutes    Ramiro Harvest, MD [facility]   To contact the attending provider between 7A-7P or the covering provider during after hours 7P-7A, please log into the web site [email or web address] and access using universal [place] password for that web site. If you do not have the password, please call the hospital operator.  09/13/2020, 3:37 PM

## 2020-09-13 NOTE — Progress Notes (Signed)
    Progress Note   Subjective  Chief Complaint: Crohn's disease with complication  Today, the patient tells me that he still has abdominal pain, he is still having frequent loose stools, but tells me he has not seen any blood today.  He is still very eager to have a colonoscopy.   Objective   Vital signs in last 24 hours: Temp:  [97.7 F (36.5 C)-98.7 F (37.1 C)] 97.7 F (36.5 C) (11/09 1408) Pulse Rate:  [73-105] 88 (11/09 1408) Resp:  [16-18] 18 (11/09 1408) BP: (131-155)/(67-97) 139/95 (11/09 1408) SpO2:  [99 %-100 %] 100 % (11/09 1408) Last BM Date: 09/12/20 General:    white male in NAD Heart:  Regular rate and rhythm; no murmurs Lungs: Respirations even and unlabored, lungs CTA bilaterally Abdomen:  Soft, generalized abdominal ttp and nondistended. Normal bowel sounds. Extremities:  Without edema. Neurologic:  Alert and oriented,  grossly normal neurologically. Psych:  Cooperative. Normal mood and affect.  Intake/Output from previous day: 11/08 0701 - 11/09 0700 In: 3348.9 [P.O.:1080; I.V.:2268.9] Out: 300 [Urine:300]  Lab Results: Recent Labs    09/12/20 0121 09/13/20 0532  WBC 12.6* 19.6*  HGB 11.8* 10.8*  HCT 37.7* 34.3*  PLT 228 242   BMET Recent Labs    09/12/20 0121 09/13/20 0532  NA 139 136  K 4.2 4.0  CL 100 103  CO2 26 24  GLUCOSE 129* 105*  BUN 10 15  CREATININE 0.76 0.90  CALCIUM 9.1 8.5*   LFT Recent Labs    09/13/20 0532  PROT 5.5*  ALBUMIN 3.4*  AST 19  ALT 17  ALKPHOS 62  BILITOT 0.6    Assessment / Plan:   Assessment: 1.  Crohn's with flare: Status post ileocecal resection and ileocolonic anastomosis, again patient has history of being on Stelara, but off for the past 3 months, no change of prednisone 40 mg daily outpatient, no real change overnight, there is some concern that this may not be a Crohn's flare, it may just be IBS given his normal labs and patient with multiple stressors, fecal calprotectin and GI pathogen  panel pending  Plan: 1.  Patient is still trying to provide stool sample for [place] 2.  Continue Solu-Medrol 60 mg daily 3.  Patient is wanting a colonoscopy, we do not have any room to schedule this tomorrow, so we will plan for it on 09/15/2020. 4.  Patient can continue current diet today, we will start the prep for colonoscopy tomorrow. 5.  Please await any further recommendations from Dr. Orvan Falconer later today.  Thank you for kind consultation, we will continue to follow.    LOS: 0 days   Unk Lightning  09/13/2020, 2:11 PM

## 2020-09-14 LAB — COMPREHENSIVE METABOLIC PANEL
ALT: 20 U/L (ref 0–44)
AST: 19 U/L (ref 15–41)
Albumin: 3.7 g/dL (ref 3.5–5.0)
Alkaline Phosphatase: 66 U/L (ref 38–126)
Anion gap: 7 (ref 5–15)
BUN: 16 mg/dL (ref 6–20)
CO2: 27 mmol/L (ref 22–32)
Calcium: 8.1 mg/dL — ABNORMAL LOW (ref 8.9–10.3)
Chloride: 99 mmol/L (ref 98–111)
Creatinine, Ser: 0.97 mg/dL (ref 0.61–1.24)
GFR, Estimated: >60 mL/min (ref >60)
Glucose, Bld: 106 mg/dL — ABNORMAL HIGH (ref 70–99)
Potassium: 3.4 mmol/L — ABNORMAL LOW (ref 3.5–5.1)
Sodium: 133 mmol/L — ABNORMAL LOW (ref 135–145)
Total Bilirubin: 0.4 mg/dL (ref 0.3–1.2)
Total Protein: 6.4 g/dL — ABNORMAL LOW (ref 6.5–8.1)

## 2020-09-14 LAB — MAGNESIUM: Magnesium: 2 mg/dL (ref 1.7–2.4)

## 2020-09-14 LAB — CBC WITH DIFFERENTIAL/PLATELET
Abs Immature Granulocytes: 0.24 10*3/uL — ABNORMAL HIGH (ref 0.00–0.07)
Basophils Absolute: 0 10*3/uL (ref 0.0–0.1)
Basophils Relative: 0 %
Eosinophils Absolute: 0 10*3/uL (ref 0.0–0.5)
Eosinophils Relative: 0 %
HCT: 34.2 % — ABNORMAL LOW (ref 39.0–52.0)
Hemoglobin: 10.8 g/dL — ABNORMAL LOW (ref 13.0–17.0)
Immature Granulocytes: 1 %
Lymphocytes Relative: 13 %
Lymphs Abs: 2.6 10*3/uL (ref 0.7–4.0)
MCH: 26.5 pg (ref 26.0–34.0)
MCHC: 31.6 g/dL (ref 30.0–36.0)
MCV: 83.8 fL (ref 80.0–100.0)
Monocytes Absolute: 1.8 10*3/uL — ABNORMAL HIGH (ref 0.1–1.0)
Monocytes Relative: 9 %
Neutro Abs: 15.8 10*3/uL — ABNORMAL HIGH (ref 1.7–7.7)
Neutrophils Relative %: 77 %
Platelets: 255 10*3/uL (ref 150–400)
RBC: 4.08 MIL/uL — ABNORMAL LOW (ref 4.22–5.81)
RDW: 16.9 % — ABNORMAL HIGH (ref 11.5–15.5)
WBC: 20.5 10*3/uL — ABNORMAL HIGH (ref 4.0–10.5)
nRBC: 0 % (ref 0.0–0.2)

## 2020-09-14 LAB — GLUCOSE, CAPILLARY: Glucose-Capillary: 91 mg/dL (ref 70–99)

## 2020-09-14 MED ORDER — PEG-KCL-NACL-NASULF-NA ASC-C 100 G PO SOLR
0.5000 | Freq: Once | ORAL | Status: AC
  Administered 2020-09-14: 22:00:00 100 g via ORAL

## 2020-09-14 MED ORDER — PEG-KCL-NACL-NASULF-NA ASC-C 100 G PO SOLR: 1.0000 | Freq: Once | ORAL | Status: DC

## 2020-09-14 MED ORDER — PEG-KCL-NACL-NASULF-NA ASC-C 100 G PO SOLR
0.5000 | Freq: Once | ORAL | Status: AC
  Administered 2020-09-14: 100 g via ORAL
  Filled 2020-09-14: qty 1

## 2020-09-14 MED ORDER — POTASSIUM CHLORIDE CRYS ER 20 MEQ PO TBCR
40.0000 meq | EXTENDED_RELEASE_TABLET | Freq: Once | ORAL | Status: AC
  Administered 2020-09-14: 40 meq via ORAL
  Filled 2020-09-14: qty 2

## 2020-09-14 NOTE — H&P (View-Only) (Signed)
Progress Note   Subjective  Chief Complaint: Crohn's disease with complication  This morning the patient initially tells me that nothing has changed, he continues with right lower quadrant pain and frequent loose stools, though when asked to quantify is that he has only had 1 loose bowel movement which is only a very small amount and followed by some mucus which was earlier this morning.  He tells me is not even enough for a stool study which she is trying to collect for [place].  He is aware of plans for colonoscopy tomorrow and excited.   Objective   Vital signs in last 24 hours: Temp:  [97.7 F (36.5 C)-98.2 F (36.8 C)] 98 F (36.7 C) (11/10 0535) Pulse Rate:  [73-88] 73 (11/10 0535) Resp:  [16-18] 16 (11/10 0535) BP: (139-169)/(71-95) 169/71 (11/10 0535) SpO2:  [99 %-100 %] 100 % (11/10 0535) Last BM Date: 09/13/20 General:    white male in NAD Heart:  Regular rate and rhythm; no murmurs Lungs: Respirations even and unlabored, lungs CTA bilaterally Abdomen:  Soft,mild RLQ ttp and nondistended. Normal bowel sounds. Extremities:  Without edema. Neurologic:  Alert and oriented,  grossly normal neurologically. Psych:  Cooperative. Normal mood and affect.  Lab Results: Recent Labs    09/12/20 0121 09/13/20 0532 09/14/20 0513  WBC 12.6* 19.6* 20.5*  HGB 11.8* 10.8* 10.8*  HCT 37.7* 34.3* 34.2*  PLT 228 242 255   BMET Recent Labs    09/12/20 0121 09/13/20 0532 09/14/20 0513  NA 139 136 133*  K 4.2 4.0 3.4*  CL 100 103 99  CO2 26 24 27   GLUCOSE 129* 105* 106*  BUN 10 15 16   CREATININE 0.76 0.90 0.97  CALCIUM 9.1 8.5* 8.1*   LFT Recent Labs    09/14/20 0513  PROT 6.4*  ALBUMIN 3.7  AST 19  ALT 20  ALKPHOS 66  BILITOT 0.4     Assessment / Plan:   Assessment: 1.  Crohn's with flare: Status post ileocecal resection and ileocolonic anastomosis, history of being on Stelara but off for the past 3 months, currently doing better on Solu-Medrol 60 mg IV daily  with less frequent bowel movements, suspicion that there may be more than a Crohn's flare going on possibly some IBS given his normal labs and multiple stressors 2.  Hypokalemia: It dropped again overnight, patient has been given 40 mEq KCl today  Plan: 1.  Patient is still trying to provide a stool sample for [place] (obviously he has had less frequent bowel movements over the past 48 hours than when he came in) 2.  Continue Solu-Medrol 60 mg daily, will discuss converting to oral after to have colonoscopy 3.  Scheduled patient for a colonoscopy tomorrow with Dr. Tarri Glenn.  Did discuss risks, benefits, limitations and alternatives and patient agrees to proceed. 4.  Movi prep was ordered for the patient to start this afternoon 5.  Patient be on a clear liquid diet and n.p.o. after midnight 6.  Please await further recommendations from Dr. Tarri Glenn later today  Thank you for kind consultation, we will continue to follow.   LOS: 1 day   Levin Erp  09/14/2020, 9:07 AM  Attending Attestation:  I have taken an interval history, reviewed the chart and examined the patient. His fiancee was present at the time of my evaluation.  I agree with PA Lemmon's note, impression and recommendations.   He reports ongoing abdominal pain and nausea - unchanged from admission. Does not  feel that he is responding to steroids. Has needed Dilauded for pain control.   Despite his symptoms, feels that he can prep for a colonoscopy.   I suspect functional overlap given normal CRP, ESR, platelets, albumin, and hemoglobin and recent CT scan. Awaiting fecal calprotectin and GI pathogen panel.He has been unable to submit a stool sample. Agree with empiric dicyclomine.    Will proceed with EGD and colonoscopy 09/15/20.   The nature of the procedure, as well as the risks, benefits, and alternatives were carefully and thoroughly reviewed with the patient. Ample time for discussion and questions allowed. The patient  understood, was satisfied, and agreed to proceed.   Thornton Park, MD, MPH [facility]

## 2020-09-14 NOTE — Progress Notes (Addendum)
Progress Note   Subjective  Chief Complaint: Crohn's disease with complication  This morning the patient initially tells me that nothing has changed, he continues with right lower quadrant pain and frequent loose stools, though when asked to quantify is that he has only had 1 loose bowel movement which is only a very small amount and followed by some mucus which was earlier this morning.  He tells me is not even enough for a stool study which she is trying to collect for [place].  He is aware of plans for colonoscopy tomorrow and excited.   Objective   Vital signs in last 24 hours: Temp:  [97.7 F (36.5 C)-98.2 F (36.8 C)] 98 F (36.7 C) (11/10 0535) Pulse Rate:  [73-88] 73 (11/10 0535) Resp:  [16-18] 16 (11/10 0535) BP: (139-169)/(71-95) 169/71 (11/10 0535) SpO2:  [99 %-100 %] 100 % (11/10 0535) Last BM Date: 09/13/20 General:    white male in NAD Heart:  Regular rate and rhythm; no murmurs Lungs: Respirations even and unlabored, lungs CTA bilaterally Abdomen:  Soft,mild RLQ ttp and nondistended. Normal bowel sounds. Extremities:  Without edema. Neurologic:  Alert and oriented,  grossly normal neurologically. Psych:  Cooperative. Normal mood and affect.  Lab Results: Recent Labs    09/12/20 0121 09/13/20 0532 09/14/20 0513  WBC 12.6* 19.6* 20.5*  HGB 11.8* 10.8* 10.8*  HCT 37.7* 34.3* 34.2*  PLT 228 242 255   BMET Recent Labs    09/12/20 0121 09/13/20 0532 09/14/20 0513  NA 139 136 133*  K 4.2 4.0 3.4*  CL 100 103 99  CO2 26 24 27   GLUCOSE 129* 105* 106*  BUN 10 15 16   CREATININE 0.76 0.90 0.97  CALCIUM 9.1 8.5* 8.1*   LFT Recent Labs    09/14/20 0513  PROT 6.4*  ALBUMIN 3.7  AST 19  ALT 20  ALKPHOS 66  BILITOT 0.4     Assessment / Plan:   Assessment: 1.  Crohn's with flare: Status post ileocecal resection and ileocolonic anastomosis, history of being on Stelara but off for the past 3 months, currently doing better on Solu-Medrol 60 mg IV daily  with less frequent bowel movements, suspicion that there may be more than a Crohn's flare going on possibly some IBS given his normal labs and multiple stressors 2.  Hypokalemia: It dropped again overnight, patient has been given 40 mEq KCl today  Plan: 1.  Patient is still trying to provide a stool sample for [place] (obviously he has had less frequent bowel movements over the past 48 hours than when he came in) 2.  Continue Solu-Medrol 60 mg daily, will discuss converting to oral after to have colonoscopy 3.  Scheduled patient for a colonoscopy tomorrow with Dr. Tarri Glenn.  Did discuss risks, benefits, limitations and alternatives and patient agrees to proceed. 4.  Movi prep was ordered for the patient to start this afternoon 5.  Patient be on a clear liquid diet and n.p.o. after midnight 6.  Please await further recommendations from Dr. Tarri Glenn later today  Thank you for kind consultation, we will continue to follow.   LOS: 1 day   Levin Erp  09/14/2020, 9:07 AM  Attending Attestation:  I have taken an interval history, reviewed the chart and examined the patient. His fiancee was present at the time of my evaluation.  I agree with PA Lemmon's note, impression and recommendations.   He reports ongoing abdominal pain and nausea - unchanged from admission. Does not  feel that he is responding to steroids. Has needed Dilauded for pain control.   Despite his symptoms, feels that he can prep for a colonoscopy.   I suspect functional overlap given normal CRP, ESR, platelets, albumin, and hemoglobin and recent CT scan. Awaiting fecal calprotectin and GI pathogen panel.He has been unable to submit a stool sample. Agree with empiric dicyclomine.    Will proceed with EGD and colonoscopy 09/15/20.   The nature of the procedure, as well as the risks, benefits, and alternatives were carefully and thoroughly reviewed with the patient. Ample time for discussion and questions allowed. The patient  understood, was satisfied, and agreed to proceed.   Thornton Park, MD, MPH [facility]

## 2020-09-14 NOTE — Progress Notes (Addendum)
PROGRESS NOTE    Raymond Huerta  OZD:[id number] DOB: [birth year] DOA: 09/12/2020 PCP: Pcp, No    Chief Complaint  Patient presents with  . Abdominal Pain    Brief Narrative:  HPI per Dr. Pleas Raymond Huerta is a 29 y.o. male with medical history significant of 29 year old male with PMHx Crohn's disease (Diagnosed by a GI doc in [place]), anxiety and depression, hypertension as well as other comorbidities presenting with abdominal pain, nausea and vomiting despite being started on steroid taper after being seen at [facility] on 11/3.  He has been having ongoing abdominal pain and bloody stools and states that he has been diagnosed with Crohn's disease previously and was on maintenance medication with Stelara but stopped taking it 3 months ago when his job changed.  He continues to have some rectal throbbing and 12 pound weight loss over the last week and a half and has had severe nausea and vomiting.  He has having 5-6 bowel movements a day and he has had some bloody stools.  He feels that he has had more stress in his life especially with his divorce and taking care of his 19 and half-year-old daughter.  Continues to have abdominal pain in the right lower quadrant and was placed on prednisone taper when he was last seen at [facility] on 11 3 and discharged.  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 Patient thought to have persisting Crohn's flare by ER provider. Patient given 160m Solumedrol, started on IV[facility].  GI consulted and resuming steroids on the patient.  Of note he has had several ER visits and he went to the ER 1015, 1028, 11 3 and [facility]  [facility] Course: In the ED he had basic blood work done and was given Solu-Medrol, ketamine, morphine and ondansetron and antiemetics.  Of note he denies any imaging done  given that he just had a CT of the abdomen pelvis from 09/07/2020.  COVID-19 testing was negative via PCR.  Assessment & Plan:   Principal Problem:   Acute Crohn's disease ([place] Active Problems:   Right lower quadrant abdominal pain   Folate deficiency   Low vitamin B12 level   Normocytic anemia   Anxiety and depression   Essential hypertension   Leukocytosis   Hyperglycemia  1 acute Crohn's flare Patient presenting with nausea, vomiting, abdominal pain, bloody stools.  Patient noted to have recently been seen in the ED 1028 and 11 3 with similar complaints.  Patient prior history of hemicolectomy.  CT abdomen and pelvis done from 09/07/2020 with probable ileocecal resection and ileocolonic anastomosis in the right lower quadrant, no acute GI process, post cholecystectomy with no biliary duct dilatation, stomach with moderate to marked distention but without perigastric stranding.  Patient states pain currently being managed on current pain medication.  Still with some nausea.  No emesis.  Denies any further bloody stools.  Stated had loose stool of mucus.  Stool studies pending.  Patient has been seen in consultation by GI who ordered a sed rate, CRP, fecal Cal protectant, GI pathogen panel, C. difficile PCR which is pending. CRP 0.6.  Sed rate at 5.  Folate levels of 4.7.  Continue current dose of Bentyl 10 mg 3 times daily, Solu-Medrol 60 mg IV daily as recommended  per GI.  IV antiemetics.  IV Dilaudid as needed for severe pain.  Fentanyl has been discontinued.  Percocet as needed for moderate pain.  Patient being scheduled for colonoscopy to be done tomorrow for further evaluation per GI. GI following and appreciate input and recommendations.  2.  Folate deficiency Likely secondary to problem #1.  Continue folic acid 1 mg daily.  Outpatient follow-up.    3.  Hypertension Controlled on current regimen of metoprolol 25 mg twice daily.    4.  Leukocytosis Likely reactive leukocytosis in the  setting of steroids.  Patient with no signs or symptoms of infection.  Urinalysis done on admission nitrite negative leukocytes negative.  Patient with no respiratory symptoms.  Patient currently afebrile.  WBC fluctuating currently at 20.5.  Monitor while on steroids.  No need for antibiotics at this time.   5.  Depression/anxiety Continue Vyvanse at reduced dose of 40 mg daily.  Could likely uptitrate back to home dose in the next 1 to 2 days.  Continue home regimen Adderall, Lexapro.  Continue Ativan twice daily as needed.  6.  Hyperglycemia Likely secondary to steroids.  Hemoglobin A1c 5.9.  Glucose on labs this morning at 106.   7.  Normocytic anemia/low vitamin B12 Patient had presented with some rectal bleeding secondary to problem #1.  Patient denies any further rectal bleeding.  Anemia panel with iron level of 96, TIBC of 392, ferritin of 11, folate of 4.7, vitamin B12 of 280.  Continue folic acid supplementation 1 mg daily, vitamin B12 1000 MCG's subcutaneously daily.  Hemoglobin currently stable at 10.8.  Follow H&H.  Transfusion threshold hemoglobin < 7. GI following.  8.  Hypokalemia Replete.    DVT prophylaxis: SCDs Code Status: Full Family Communication: Updated patient and significant other at bedside. Disposition:   Status is: Inpatient    Dispo: The patient is from: Home              Anticipated d/c is to: Home              Anticipated d/c date is: 2 to 3 days.              Patient currently on IV Solu-Medrol, being managed for Crohn's flare, patient scheduled for colonoscopy tomorrow, not stable for discharge.       Consultants:   Gastroenterology: Dr. Tarri Glenn 09/12/2020  Procedures:  None  Antimicrobials:   None   Subjective: Patient sitting up in bed, significant other at bedside.  Still with complaints of lower abdominal pain.  Some nausea.  Denies any emesis.  Able to tolerate clears.  Denies any further bloody bowel movements however stated had a  small whitish mucousy bowel movement.  Still with lower abdominal pain.    Objective: Vitals:   09/13/20 0600 09/13/20 1408 09/13/20 2110 09/14/20 0535  BP: 131/67 (!) 139/95 (!) 153/91 (!) 169/71  Pulse: 85 88 79 73  Resp: 17 18 16 16   Temp: 98 F (36.7 C) 97.7 F (36.5 C) 98.2 F (36.8 C) 98 F (36.7 C)  TempSrc: Oral Oral Oral Oral  SpO2: 100% 100% 99% 100%  Weight:      Height:        Intake/Output Summary (Last 24 hours) at 09/14/2020 1311 Last data filed at 09/14/2020 1000 Gross per 24 hour  Intake 480 ml  Output 0 ml  Net 480 ml   Filed Weights   09/12/20 0100  Weight: 61.2 kg    Examination:  General  exam: NAD Respiratory system: CTA B.  No wheezes, no crackles, no rhonchi.  Normal respiratory effort.   Cardiovascular system: Regular rate and rhythm no murmurs rubs or gallops.  No JVD.  No lower extremity edema. Gastrointestinal system: Abdomen is soft, nondistended, positive bowel sounds, tender to palpation in the lower abdomen.  Positive bowel sounds.  Central nervous system: Alert and oriented. No focal neurological deficits. Extremities: Symmetric 5 x 5 power. Skin: No rashes, lesions or ulcers Psychiatry: Judgement and insight appear normal. Mood & affect appropriate.     Data Reviewed: I have personally reviewed following labs and imaging studies  CBC: Recent Labs  Lab 09/12/20 0121 09/13/20 0532 09/14/20 0513  WBC 12.6* 19.6* 20.5*  NEUTROABS 10.8* 16.1* 15.8*  HGB 11.8* 10.8* 10.8*  HCT 37.7* 34.3* 34.2*  MCV 84.2 84.1 83.8  PLT 228 242 017    Basic Metabolic Panel: Recent Labs  Lab 09/12/20 0121 09/13/20 0532 09/14/20 0513  NA 139 136 133*  K 4.2 4.0 3.4*  CL 100 103 99  CO2 26 24 27   GLUCOSE 129* 105* 106*  BUN 10 15 16   CREATININE 0.76 0.90 0.97  CALCIUM 9.1 8.5* 8.1*  MG  --  2.3 2.0  PHOS  --  3.8  --     GFR: Estimated Creatinine Clearance: 98.1 mL/min (by C-G formula based on SCr of 0.97 mg/dL).  Liver Function  Tests: Recent Labs  Lab 09/12/20 0121 09/13/20 0532 09/14/20 0513  AST 28 19 19   ALT 21 17 20   ALKPHOS 85 62 66  BILITOT 0.2* 0.6 0.4  PROT 6.8 5.5* 6.4*  ALBUMIN 3.9 3.4* 3.7    CBG: Recent Labs  Lab 09/14/20 0807  GLUCAP 91     Recent Results (from the past 240 hour(s))  Respiratory Panel by RT PCR (Flu A&B, Covid) - Nasopharyngeal Swab     Status: None   Collection Time: 09/12/20  3:08 AM   Specimen: Nasopharyngeal Swab  Result Value Ref Range Status   SARS Coronavirus 2 by RT PCR NEGATIVE NEGATIVE Final    Comment: (NOTE) SARS-CoV-2 target nucleic acids are NOT DETECTED.  The SARS-CoV-2 RNA is generally detectable in upper respiratoy specimens during the acute phase of infection. The lowest concentration of SARS-CoV-2 viral copies this assay can detect is 131 copies/mL. A negative result does not preclude SARS-Cov-2 infection and should not be used as the sole basis for treatment or other patient management decisions. A negative result may occur with  improper specimen collection/handling, submission of specimen other than nasopharyngeal swab, presence of viral mutation(s) within the areas targeted by this assay, and inadequate number of viral copies (<131 copies/mL). A negative result must be combined with clinical observations, patient history, and epidemiological information. The expected result is Negative.  Fact Sheet for Patients:  PinkCheek.be  Fact Sheet for Healthcare Providers:  GravelBags.it  This test is no t yet approved or cleared by the [place] FDA and  has been authorized for detection and/or diagnosis of SARS-CoV-2 by FDA under an Emergency Use Authorization (EUA). This EUA will remain  in effect (meaning this test can be used) for the duration of the COVID-19 declaration under Section 564(b)(1) of the Act, 21 U.S.C. section 360bbb-3(b)(1), unless the authorization is terminated  or revoked sooner.     Influenza A by PCR NEGATIVE NEGATIVE Final   Influenza B by PCR NEGATIVE NEGATIVE Final    Comment: (NOTE) The Xpert Xpress SARS-CoV-2/FLU/RSV assay is intended as an aid  in  the diagnosis of influenza from Nasopharyngeal swab specimens and  should not be used as a sole basis for treatment. Nasal washings and  aspirates are unacceptable for Xpert Xpress SARS-CoV-2/FLU/RSV  testing.  Fact Sheet for Patients: PinkCheek.be  Fact Sheet for Healthcare Providers: GravelBags.it  This test is not yet approved or cleared by the [place] FDA and  has been authorized for detection and/or diagnosis of SARS-CoV-2 by  FDA under an Emergency Use Authorization (EUA). This EUA will remain  in effect (meaning this test can be used) for the duration of the  Covid-19 declaration under Section 564(b)(1) of the Act, 21  U.S.C. section 360bbb-3(b)(1), unless the authorization is  terminated or revoked. Performed at [facility], [address] [place], [place] [address]          Radiology Studies: No results found.      Scheduled Meds: . amphetamine-dextroamphetamine  15 mg Oral QHS  . cyanocobalamin  1,000 mcg Subcutaneous Daily  . dicyclomine  10 mg Oral TID AC  . escitalopram  10 mg Oral Daily  . folic acid  1 mg Oral Daily  . lisdexamfetamine  40 mg Oral QAC breakfast  . methylPREDNISolone (SOLU-MEDROL) injection  60 mg Intravenous Daily  . metoprolol tartrate  25 mg Oral BID  . nicotine  21 mg Transdermal Daily  . peg 3350 powder  0.5 kit Oral Once   And  . peg 3350 powder  0.5 kit Oral Once   Continuous Infusions: . lactated ringers 125 mL/hr at 09/13/20 1840     LOS: 1 day    Time spent: 35 minutes    Irine Seal, MD [facility]   To contact the attending provider between 7A-7P or the covering provider during after hours 7P-7A, please log into the web site  [email or web address] and access using universal [place] password for that web site. If you do not have the password, please call the hospital operator.  09/14/2020, 1:11 PM

## 2020-09-15 ENCOUNTER — Inpatient Hospital Stay (HOSPITAL_COMMUNITY): Payer: Self-pay | Admitting: Anesthesiology

## 2020-09-15 ENCOUNTER — Encounter (HOSPITAL_COMMUNITY)
Admission: EM | Disposition: A | Discharge status: Home / Self Care | Payer: Self-pay | Source: Home / Self Care | Attending: Internal Medicine

## 2020-09-15 ENCOUNTER — Encounter (HOSPITAL_COMMUNITY): Payer: Self-pay | Admitting: Internal Medicine

## 2020-09-15 DIAGNOSIS — K5010 Crohn's disease of large intestine without complications: Secondary | ICD-10-CM

## 2020-09-15 DIAGNOSIS — K3189 Other diseases of stomach and duodenum: Secondary | ICD-10-CM

## 2020-09-15 DIAGNOSIS — R109 Unspecified abdominal pain: Secondary | ICD-10-CM

## 2020-09-15 DIAGNOSIS — R110 Nausea: Secondary | ICD-10-CM

## 2020-09-15 HISTORY — PX: COLONOSCOPY WITH PROPOFOL: SHX5780

## 2020-09-15 HISTORY — PX: BIOPSY: SHX5522

## 2020-09-15 HISTORY — PX: ESOPHAGOGASTRODUODENOSCOPY (EGD) WITH PROPOFOL: SHX5813

## 2020-09-15 LAB — BASIC METABOLIC PANEL
Anion gap: 7 (ref 5–15)
BUN: 11 mg/dL (ref 6–20)
CO2: 28 mmol/L (ref 22–32)
Calcium: 8.2 mg/dL — ABNORMAL LOW (ref 8.9–10.3)
Chloride: 101 mmol/L (ref 98–111)
Creatinine, Ser: 0.95 mg/dL (ref 0.61–1.24)
GFR, Estimated: >60 mL/min (ref >60)
Glucose, Bld: 96 mg/dL (ref 70–99)
Potassium: 3.8 mmol/L (ref 3.5–5.1)
Sodium: 136 mmol/L (ref 135–145)

## 2020-09-15 LAB — GLUCOSE, CAPILLARY: Glucose-Capillary: 97 mg/dL (ref 70–99)

## 2020-09-15 LAB — CBC WITH DIFFERENTIAL/PLATELET
Abs Immature Granulocytes: 0.11 10*3/uL — ABNORMAL HIGH (ref 0.00–0.07)
Basophils Absolute: 0 10*3/uL (ref 0.0–0.1)
Basophils Relative: 0 %
Eosinophils Absolute: 0 10*3/uL (ref 0.0–0.5)
Eosinophils Relative: 0 %
HCT: 31.5 % — ABNORMAL LOW (ref 39.0–52.0)
Hemoglobin: 10.1 g/dL — ABNORMAL LOW (ref 13.0–17.0)
Immature Granulocytes: 1 %
Lymphocytes Relative: 13 %
Lymphs Abs: 2.3 10*3/uL (ref 0.7–4.0)
MCH: 26.9 pg (ref 26.0–34.0)
MCHC: 32.1 g/dL (ref 30.0–36.0)
MCV: 83.8 fL (ref 80.0–100.0)
Monocytes Absolute: 1.4 10*3/uL — ABNORMAL HIGH (ref 0.1–1.0)
Monocytes Relative: 8 %
Neutro Abs: 13.1 10*3/uL — ABNORMAL HIGH (ref 1.7–7.7)
Neutrophils Relative %: 78 %
Platelets: 202 10*3/uL (ref 150–400)
RBC: 3.76 MIL/uL — ABNORMAL LOW (ref 4.22–5.81)
RDW: 17.1 % — ABNORMAL HIGH (ref 11.5–15.5)
WBC: 16.9 10*3/uL — ABNORMAL HIGH (ref 4.0–10.5)
nRBC: 0 % (ref 0.0–0.2)

## 2020-09-15 LAB — MAGNESIUM: Magnesium: 2.1 mg/dL (ref 1.7–2.4)

## 2020-09-15 LAB — CALPROTECTIN, FECAL: Calprotectin, Fecal: 72 ug/g (ref 0–120)

## 2020-09-15 SURGERY — ESOPHAGOGASTRODUODENOSCOPY (EGD) WITH PROPOFOL
Anesthesia: Monitor Anesthesia Care

## 2020-09-15 SURGERY — COLONOSCOPY WITH PROPOFOL
Anesthesia: Monitor Anesthesia Care

## 2020-09-15 MED ORDER — HYDROMORPHONE HCL 1 MG/ML IJ SOLN
INTRAMUSCULAR | Status: AC
  Filled 2020-09-15: qty 1

## 2020-09-15 MED ORDER — PROPOFOL 500 MG/50ML IV EMUL
INTRAVENOUS | Status: AC
  Filled 2020-09-15: qty 100

## 2020-09-15 MED ORDER — PROPOFOL 1000 MG/100ML IV EMUL
INTRAVENOUS | Status: AC
  Filled 2020-09-15: qty 100

## 2020-09-15 MED ORDER — SODIUM CHLORIDE 0.9 % IV SOLN: INTRAVENOUS | Status: DC

## 2020-09-15 MED ORDER — PROPOFOL 500 MG/50ML IV EMUL
INTRAVENOUS | Status: DC | PRN
  Administered 2020-09-15: 150 ug/kg/min via INTRAVENOUS

## 2020-09-15 MED ORDER — LIDOCAINE 2% (20 MG/ML) 5 ML SYRINGE
INTRAMUSCULAR | Status: DC | PRN
  Administered 2020-09-15: 60 mg via INTRAVENOUS

## 2020-09-15 MED ORDER — PROPOFOL 10 MG/ML IV BOLUS
INTRAVENOUS | Status: DC | PRN
  Administered 2020-09-15: 40 mg via INTRAVENOUS
  Administered 2020-09-15: 30 mg via INTRAVENOUS

## 2020-09-15 SURGICAL SUPPLY — 25 items

## 2020-09-15 NOTE — Op Note (Signed)
[facility] Patient Name: Raymond Huerta Procedure Date: 09/15/2020 MRN: [id number] Attending MD: Tressia Danas MD, MD Date of Birth: 06/01/1991 CSN: [id number] Age: 29 Admit Type: Inpatient Procedure:                Colonoscopy Indications:              Abdominal pain, Assess therapeutic response to                            therapy of Crohn's disease of the colon                           History of ileocecal resection; recurrent symptoms                            off Stelera, not improving with IV stoids Providers:                Tressia Danas MD, MD, Charlett Lango, RN, Michele Mcalpine Technician Referring MD:              Medicines:                Monitored Anesthesia Care Complications:            No immediate complications. Estimated blood loss:                            Minimal. Estimated Blood Loss:     Estimated blood loss was minimal. Procedure:                Pre-Anesthesia Assessment:                           - Prior to the procedure, a History and Physical                            was performed, and patient medications and                            allergies were reviewed. The patient's tolerance of                            previous anesthesia was also reviewed. The risks                            and benefits of the procedure and the sedation                            options and risks were discussed with the patient.                            All questions were answered, and informed consent                            was obtained. Prior  Anticoagulants: The patient has                            taken no previous anticoagulant or antiplatelet                            agents. ASA Grade Assessment: II - A patient with                            mild systemic disease. After reviewing the risks                            and benefits, the patient was deemed in                            satisfactory condition  to undergo the procedure.                           After obtaining informed consent, the colonoscope                            was passed under direct vision. Throughout the                            procedure, the patient's blood pressure, pulse, and                            oxygen saturations were monitored continuously. The                            CF-HQ190L ([id number]) Olympus colonoscope was                            introduced through the anus and advanced to the 5                            cm into the ileum. The colonoscopy was performed                            without difficulty. The patient tolerated the                            procedure well. The quality of the bowel                            preparation was adequate. Semi-liquid stool was                            present in the distal transverse colon and                            descending colon limiting a complete evaluation in  those areas. The terminal ileum, ileocecal valve,                            appendiceal orifice, and rectum were photographed. Scope In: 1:10:16 PM Scope Out: 1:24:30 PM Scope Withdrawal Time: 0 hours 10 minutes 10 seconds  Total Procedure Duration: 0 hours 14 minutes 14 seconds  Findings:      The perianal and digital rectal examinations were normal.      The colon (entire examined portion) appeared normal. Biopsies were taken       from the rectum, left colon, transverse colon, and right colon with a       cold forceps for histology. Estimated blood loss was minimal.      There was evidence of a prior end-to-side ileo-colonic anastomosis at       the ileocecal valve. This was patent and was characterized by ulceration       and visible sutures. Biopsies were taken with a cold forceps for       histology. Estimated blood loss was minimal.      The neo-terminal ileum appeared normal. Biopsies were taken with a cold       forceps for histology. Estimated  blood loss was minimal.      Semi-liquid stool was present in the distal transverse colon and       descending colon limiting a complete evaluation in those areas. The exam       was otherwise without abnormality on direct and retroflexion views. Impression:               - The entire examined colon is normal. No                            endoscopic evidence for colitis. Biopsied.                           - Ulcerated ileocolonic anastomosis. Biopsied.                           - The examined portion of the ileum was normal. No                            endoscopic evidence for ileitis. Biopsied. Moderate Sedation:      Not Applicable - Patient had care per Anesthesia. Recommendation:           - Return patient to hospital ward for ongoing care.                           - Advance diet as tolerated.                           - Continue present medications.                           - Await pathology results to determine best course                            of treatment for anastomotic ulcer. Procedure Code(s):        --- Professional ---  17408, Colonoscopy, flexible; with biopsy, single                            or multiple Diagnosis Code(s):        --- Professional ---                           Z98.0, Intestinal bypass and anastomosis status                           R10.9, Unspecified abdominal pain                           K50.10, Crohn's disease of large intestine without                            complications CPT copyright 2019 American Medical Association. All rights reserved. The codes documented in this report are preliminary and upon coder review may  be revised to meet current compliance requirements. Tressia Danas MD, MD 09/15/2020 1:47:36 PM This report has been signed electronically. Number of Addenda: 0

## 2020-09-15 NOTE — Interval H&P Note (Signed)
History and Physical Interval Note:  09/15/2020 12:14 PM  Raymond Huerta  has presented today for surgery, with the diagnosis of Crohns with flare; nausea.  The various methods of treatment have been discussed with the patient and family. After consideration of risks, benefits and other options for treatment, the patient has consented to  Procedure(s): COLONOSCOPY WITH PROPOFOL (N/A) ESOPHAGOGASTRODUODENOSCOPY (EGD) WITH PROPOFOL (N/A) as a surgical intervention.  The patient's history has been reviewed, patient examined, no change in status, stable for surgery.  I have reviewed the patient's chart and labs.  Questions were answered to the patient's satisfaction.     Tressia Danas

## 2020-09-15 NOTE — Anesthesia Preprocedure Evaluation (Addendum)
Anesthesia Evaluation  Patient identified by MRN, date of birth, ID band Patient awake    Reviewed: Allergy & Precautions, NPO status , Patient's Chart, lab work & pertinent test results  History of Anesthesia Complications Negative for: history of anesthetic complications  Airway Mallampati: II  TM Distance: >3 FB Neck ROM: Full    Dental  (+) Dental Advisory Given, Teeth Intact   Pulmonary neg pulmonary ROS,    Pulmonary exam normal        Cardiovascular hypertension (formerly on meds), Normal cardiovascular exam     Neuro/Psych PSYCHIATRIC DISORDERS Anxiety Depression negative neurological ROS     GI/Hepatic Neg liver ROS,  Crohn's disease    Endo/Other  negative endocrine ROS  Renal/GU negative Renal ROS     Musculoskeletal  (+) Arthritis ,   Abdominal   Peds  Hematology  (+) anemia ,   Anesthesia Other Findings Covid test negative   Reproductive/Obstetrics                            Anesthesia Physical Anesthesia Plan  ASA: II  Anesthesia Plan: MAC   Post-op Pain Management:    Induction: Intravenous  PONV Risk Score and Plan: 1 and Propofol infusion and Treatment may vary due to age or medical condition  Airway Management Planned: Nasal Cannula and Natural Airway  Additional Equipment: None  Intra-op Plan:   Post-operative Plan:   Informed Consent: I have reviewed the patients History and Physical, chart, labs and discussed the procedure including the risks, benefits and alternatives for the proposed anesthesia with the patient or authorized representative who has indicated his/her understanding and acceptance.       Plan Discussed with: CRNA and Anesthesiologist  Anesthesia Plan Comments:        Anesthesia Quick Evaluation

## 2020-09-15 NOTE — Op Note (Signed)
[facility] Patient Name: Raymond Huerta Procedure Date: 09/15/2020 MRN: [id number] Attending MD: Tressia Danas MD, MD Date of Birth: [birth year] CSN: [id number] Age: 29 Admit Type: Inpatient Procedure:                Upper GI endoscopy Indications:              Abdominal pain, Nausea Providers:                Tressia Danas MD, MD, Charlett Lango, RN, Michele Mcalpine Technician Referring MD:              Medicines:                Monitored Anesthesia Care Complications:            No immediate complications. Estimated blood loss:                            Minimal. Estimated Blood Loss:     Estimated blood loss was minimal. Procedure:                Pre-Anesthesia Assessment:                           - Prior to the procedure, a History and Physical                            was performed, and patient medications and                            allergies were reviewed. The patient's tolerance of                            previous anesthesia was also reviewed. The risks                            and benefits of the procedure and the sedation                            options and risks were discussed with the patient.                            All questions were answered, and informed consent                            was obtained. Prior Anticoagulants: The patient has                            taken no previous anticoagulant or antiplatelet                            agents. ASA Grade Assessment: II - A patient with                            mild  systemic disease. After reviewing the risks                            and benefits, the patient was deemed in                            satisfactory condition to undergo the procedure.                           After obtaining informed consent, the endoscope was                            passed under direct vision. Throughout the                            procedure, the patient's  blood pressure, pulse, and                            oxygen saturations were monitored continuously. The                            [id number]-[id number] ([id number]) Olympus gastroscope was                            introduced through the mouth, and advanced to the                            third part of duodenum. The upper GI endoscopy was                            accomplished without difficulty. The patient                            tolerated the procedure well. Scope In: Scope Out: Findings:      The examined esophagus was normal.      Diffuse mildly erythematous mucosa without bleeding was found in the       gastric body. Biopsies were taken from the antrum, body, and fundus with       a cold forceps for histology. Estimated blood loss was minimal.      The examined duodenum was normal. Biopsies were taken with a cold       forceps for histology. Estimated blood loss was minimal.      The cardia and gastric fundus were normal on retroflexion.      The exam was otherwise without abnormality. Impression:               - Normal esophagus.                           - Erythematous mucosa in the gastric body. Biopsied.                           - Normal examined duodenum. Biopsied.                           -  No obvious Crohn's on this study. Moderate Sedation:      Not Applicable - Patient had care per Anesthesia. Recommendation:           - Return patient to hospital ward for ongoing care.                           - Advance diet as tolerated.                           - Continue present medications.                           - Await pathology results.                           - Proceed with Crohn's as previously scheduled. Procedure Code(s):        --- Professional ---                           [phone], Esophagogastroduodenoscopy, flexible,                            transoral; with biopsy, single or multiple Diagnosis Code(s):        --- Professional ---                           K31.89,  Other diseases of stomach and duodenum                           R10.9, Unspecified abdominal pain                           R11.0, Nausea CPT copyright 2019 American Medical Association. All rights reserved. The codes documented in this report are preliminary and upon coder review may  be revised to meet current compliance requirements. Tressia Danas MD, MD 09/15/2020 1:34:35 PM This report has been signed electronically. Number of Addenda: 0

## 2020-09-15 NOTE — Progress Notes (Signed)
PROGRESS NOTE    Raymond Huerta  JSE:[id number] DOB: 06/11/1991 DOA: 09/12/2020 PCP: Pcp, No    Chief Complaint  Patient presents with  . Abdominal Pain    Brief Narrative:  HPI per Dr. Gertie Baron [name] is a 29 y.o. male with medical history significant of 29 year old male with PMHx Crohn's disease (Diagnosed by a GI doc in [place]), anxiety and depression, hypertension as well as other comorbidities presenting with abdominal pain, nausea and vomiting despite being started on steroid taper after being seen at [facility] on 11/3.  He has been having ongoing abdominal pain and bloody stools and states that he has been diagnosed with Crohn's disease previously and was on maintenance medication with Stelara but stopped taking it 3 months ago when his job changed.  He continues to have some rectal throbbing and 12 pound weight loss over the last week and a half and has had severe nausea and vomiting.  He has having 5-6 bowel movements a day and he has had some bloody stools.  He feels that he has had more stress in his life especially with his divorce and taking care of his 37 and half-year-old daughter.  Continues to have abdominal pain in the right lower quadrant and was placed on prednisone taper when he was last seen at [facility] on 11 3 and discharged.  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 Patient thought to have persisting Crohn's flare by ER provider. Patient given 125mg  Solumedrol, started on IVF and transferred to [facility] for further management and evaluation.  GI consulted and resuming steroids on the patient.  Of note he has had several ER visits and he went to the ER 1015, 1028, 11 3 and he was seen by telemedicine on 07/07/2019 by [facility].  ED Course: In the ED he had basic blood work done and was given Solu-Medrol, ketamine, morphine and ondansetron and antiemetics.  Of note he denies any imaging done  given that he just had a CT of the abdomen pelvis from 09/07/2020.  COVID-19 testing was negative via PCR.  Assessment & Plan:   Principal Problem:   Acute Crohn's disease (HCC) Active Problems:   Right lower quadrant abdominal pain   Folate deficiency   Low vitamin B12 level   Normocytic anemia   Anxiety and depression   Essential hypertension   Leukocytosis   Hyperglycemia  1 acute Crohn's flare Patient presenting with nausea, vomiting, abdominal pain, bloody stools.  Patient noted to have recently been seen in the ED 1028 and 11 3 with similar complaints.  Patient prior history of hemicolectomy.  CT abdomen and pelvis done from 09/07/2020 with probable ileocecal resection and ileocolonic anastomosis in the right lower quadrant, no acute GI process, post cholecystectomy with no biliary duct dilatation, stomach with moderate to marked distention but without perigastric stranding.  Patient still with ongoing lower abdominal pain requiring IV pain medication for management.  Patient still with nausea.  Denies any emesis.  No further bloody bowel movements.  Stool studies pending.  Patient has been seen in consultation by GI who ordered a sed rate, CRP, fecal Calprotectin, GI pathogen panel, C. difficile PCR which is pending. CRP 0.6.  Sed rate at 5.  Folate levels of 4.7.  Continue current dose of Bentyl 10 mg 3 times daily, Solu-Medrol 60 mg IV daily as recommended per GI.  IV  antiemetics.  IV Dilaudid as needed for severe pain.  Fentanyl has been discontinued.  IV antiemetics.  Percocet as needed for moderate pain.  Patient for endoscopy and colonoscopy today for further evaluation.  GI following and appreciate input and recommendations.  2.  Folate deficiency Likely secondary to problem #1.  Continue folic acid 1 mg daily.  Outpatient follow-up.    3.  Hypertension Continue metoprolol 25 mg twice daily.   4.  Leukocytosis Likely reactive leukocytosis in the setting of steroids.  Patient  with no signs or symptoms of infection.  Urinalysis done on admission nitrite negative leukocytes negative.  Patient with no respiratory symptoms.  Patient currently afebrile.  Leukocytosis trending down.  No need for antibiotics at this time.   5.  Depression/anxiety Continue Vyvanse at reduced dose of 40 mg daily.  Could likely uptitrate back to home dose on discharge.  Continue home regimen Lexapro, Adderall.  Ativan twice daily as needed.    6.  Hyperglycemia Likely secondary to steroids.  Hemoglobin A1c 5.9.  CBG 97 this morning.  7.  Normocytic anemia/low vitamin B12 Patient had presented with some rectal bleeding secondary to problem #1.  Patient denies any further rectal bleeding.  Anemia panel with iron level of 96, TIBC of 392, ferritin of 11, folate of 4.7, vitamin B12 of 280.  Continue folic acid supplementation 1 mg daily, vitamin B12 1000 MCG's subcutaneously daily.  Hemoglobin currently stable at 10.1.  Follow H&H.  Transfusion threshold hemoglobin < 7. GI following.  8.  Hypokalemia Repleted.  Potassium at 3.8.    DVT prophylaxis: SCDs Code Status: Full Family Communication: Updated patient.  No family at bedside.  Disposition:   Status is: Inpatient    Dispo: The patient is from: Home              Anticipated d/c is to: Home              Anticipated d/c date is: To be determined.              Patient currently on IV Solu-Medrol, being managed for Crohn's flare, patient scheduled for endoscopy/colonoscopy today.  Not stable for discharge.        Consultants:   Gastroenterology: Dr. Orvan Falconer 09/12/2020  Procedures:  None  Antimicrobials:   None   Subjective: Patient sleeping but arousable.  Denies any chest pain.  Denies any shortness of breath.  States he feels miserable complaining of lower abdominal pain.  Patient also with some nausea.  No emesis.  States able to tolerate bowel prep yesterday.  Awaiting endoscopic procedures today.    Objective: Vitals:   09/14/20 0535 09/14/20 1432 09/14/20 2140 09/15/20 0518  BP: (!) 169/71 130/80 135/70 (!) 133/99  Pulse: 73 62 61 78  Resp: 16 18 19 18   Temp: 98 F (36.7 C) 98.3 F (36.8 C) 98.1 F (36.7 C) 98.1 F (36.7 C)  TempSrc: Oral Oral Oral Oral  SpO2: 100% 99% 100% 100%  Weight:    60.5 kg  Height:        Intake/Output Summary (Last 24 hours) at 09/15/2020 0804 Last data filed at 09/14/2020 1800 Gross per 24 hour  Intake 2378.3 ml  Output --  Net 2378.3 ml   Filed Weights   09/12/20 0100 09/15/20 0518  Weight: 61.2 kg 60.5 kg    Examination:  General exam: NAD Respiratory system: Lungs clear to auscultation anterior lung fields.  No wheezes, no crackles, no rhonchi.  Normal  respiratory effort.  Speaking in full sentences.  Cardiovascular system: RRR no murmurs rubs or gallops.  No JVD.  No lower extremity edema.  Gastrointestinal system: Abdomen is soft, nondistended, hyperactive bowel sounds, tender to palpation in the lower abdomen.  No rebound.  No guarding.  Central nervous system: Alert and oriented. No focal neurological deficits. Extremities: Symmetric 5 x 5 power. Skin: No rashes, lesions or ulcers Psychiatry: Judgement and insight appear normal. Mood & affect appropriate.     Data Reviewed: I have personally reviewed following labs and imaging studies  CBC: Recent Labs  Lab 09/12/20 0121 09/13/20 0532 09/14/20 0513 09/15/20 0501  WBC 12.6* 19.6* 20.5* 16.9*  NEUTROABS 10.8* 16.1* 15.8* 13.1*  HGB 11.8* 10.8* 10.8* 10.1*  HCT 37.7* 34.3* 34.2* 31.5*  MCV 84.2 84.1 83.8 83.8  PLT 228 242 255 202    Basic Metabolic Panel: Recent Labs  Lab 09/12/20 0121 09/13/20 0532 09/14/20 0513 09/15/20 0501  NA 139 136 133* 136  K 4.2 4.0 3.4* 3.8  CL 100 103 99 101  CO2 26 24 27 28   GLUCOSE 129* 105* 106* 96  BUN 10 15 16 11   CREATININE 0.76 0.90 0.97 0.95  CALCIUM 9.1 8.5* 8.1* 8.2*  MG  --  2.3 2.0 2.1  PHOS  --  3.8  --   --      GFR: Estimated Creatinine Clearance: 99.1 mL/min (by C-G formula based on SCr of 0.95 mg/dL).  Liver Function Tests: Recent Labs  Lab 09/12/20 0121 09/13/20 0532 09/14/20 0513  AST 28 19 19   ALT 21 17 20   ALKPHOS 85 62 66  BILITOT 0.2* 0.6 0.4  PROT 6.8 5.5* 6.4*  ALBUMIN 3.9 3.4* 3.7    CBG: Recent Labs  Lab 09/14/20 0807  GLUCAP 91     Recent Results (from the past 240 hour(s))  Respiratory Panel by RT PCR (Flu A&B, Covid) - Nasopharyngeal Swab     Status: None   Collection Time: 09/12/20  3:08 AM   Specimen: Nasopharyngeal Swab  Result Value Ref Range Status   SARS Coronavirus 2 by RT PCR NEGATIVE NEGATIVE Final    Comment: (NOTE) SARS-CoV-2 target nucleic acids are NOT DETECTED.  The SARS-CoV-2 RNA is generally detectable in upper respiratoy specimens during the acute phase of infection. The lowest concentration of SARS-CoV-2 viral copies this assay can detect is 131 copies/mL. A negative result does not preclude SARS-Cov-2 infection and should not be used as the sole basis for treatment or other patient management decisions. A negative result may occur with  improper specimen collection/handling, submission of specimen other than nasopharyngeal swab, presence of viral mutation(s) within the areas targeted by this assay, and inadequate number of viral copies (<131 copies/mL). A negative result must be combined with clinical observations, patient history, and epidemiological information. The expected result is Negative.  Fact Sheet for Patients:   Fact Sheet for Healthcare Providers:   This test is no t yet approved or cleared by the 13/10/21 FDA and  has been authorized for detection and/or diagnosis of SARS-CoV-2 by FDA under an Emergency Use Authorization (EUA). This EUA will remain  in effect (meaning this test can be used) for the duration of the COVID-19  declaration under Section 564(b)(1) of the Act, 21 U.S.C. section 360bbb-3(b)(1), unless the authorization is terminated or revoked sooner.     Influenza A by PCR NEGATIVE NEGATIVE Final   Influenza B by PCR NEGATIVE NEGATIVE Final    Comment: (  NOTE) The Xpert Xpress SARS-CoV-2/FLU/RSV assay is intended as an aid in  the diagnosis of influenza from Nasopharyngeal swab specimens and  should not be used as a sole basis for treatment. Nasal washings and  aspirates are unacceptable for Xpert Xpress SARS-CoV-2/FLU/RSV  testing.  Fact Sheet for Patients: https://www.moore.com/  Fact Sheet for Healthcare Providers: https://www.young.biz/  This test is not yet approved or cleared by the [place] FDA and  has been authorized for detection and/or diagnosis of SARS-CoV-2 by  FDA under an Emergency Use Authorization (EUA). This EUA will remain  in effect (meaning this test can be used) for the duration of the  Covid-19 declaration under Section 564(b)(1) of the Act, 21  U.S.C. section 360bbb-3(b)(1), unless the authorization is  terminated or revoked. Performed at [facility], [address] [place], [place] [address]          Radiology Studies: No results found.      Scheduled Meds: . amphetamine-dextroamphetamine  15 mg Oral QHS  . cyanocobalamin  1,000 mcg Subcutaneous Daily  . dicyclomine  10 mg Oral TID AC  . escitalopram  10 mg Oral Daily  . folic acid  1 mg Oral Daily  . lisdexamfetamine  40 mg Oral QAC breakfast  . methylPREDNISolone (SOLU-MEDROL) injection  60 mg Intravenous Daily  . metoprolol tartrate  25 mg Oral BID  . nicotine  21 mg Transdermal Daily   Continuous Infusions: . lactated ringers 125 mL/hr at 09/15/20 0513     LOS: 2 days    Time spent: 35 minutes    Ramiro Harvest, MD [facility]   To contact the attending provider between 7A-7P or the covering provider during after  hours 7P-7A, please log into the web site [email or web address] and access using universal [place] password for that web site. If you do not have the password, please call the hospital operator.  09/15/2020, 8:04 AM

## 2020-09-15 NOTE — Transfer of Care (Signed)
Immediate Anesthesia Transfer of Care Note  Patient: Raymond Huerta  Procedure(s) Performed: COLONOSCOPY WITH PROPOFOL (N/A ) ESOPHAGOGASTRODUODENOSCOPY (EGD) WITH PROPOFOL (N/A ) BIOPSY  Patient Location: Endoscopy Unit  Anesthesia Type:MAC  Level of Consciousness: drowsy and patient cooperative  Airway & Oxygen Therapy: Patient Spontanous Breathing and Patient connected to face mask oxygen  Post-op Assessment: Report given to RN and Post -op Vital signs reviewed and stable  Post vital signs: Reviewed and stable  Last Vitals:  Vitals Value Taken Time  BP 112/73   Temp    Pulse 49 09/15/20 1330  Resp 13 09/15/20 1330  SpO2 100 % 09/15/20 1330  Vitals shown include unvalidated device data.  Last Pain:  Vitals:   09/15/20 1222  TempSrc: Oral  PainSc: 8       Patients Stated Pain Goal: 3 (27/61/84 8592)  Complications: No complications documented.

## 2020-09-15 NOTE — Progress Notes (Signed)
Verbal okay given to RN to give zofran prn early.

## 2020-09-16 ENCOUNTER — Other Ambulatory Visit: Payer: Self-pay

## 2020-09-16 DIAGNOSIS — K50911 Crohn's disease, unspecified, with rectal bleeding: Secondary | ICD-10-CM

## 2020-09-16 DIAGNOSIS — K529 Noninfective gastroenteritis and colitis, unspecified: Secondary | ICD-10-CM

## 2020-09-16 LAB — MAGNESIUM: Magnesium: 2.3 mg/dL (ref 1.7–2.4)

## 2020-09-16 LAB — CBC WITH DIFFERENTIAL/PLATELET
Abs Immature Granulocytes: 0.24 10*3/uL — ABNORMAL HIGH (ref 0.00–0.07)
Basophils Absolute: 0.1 10*3/uL (ref 0.0–0.1)
Basophils Relative: 0 %
Eosinophils Absolute: 0 10*3/uL (ref 0.0–0.5)
Eosinophils Relative: 0 %
HCT: 36.8 % — ABNORMAL LOW (ref 39.0–52.0)
Hemoglobin: 11.4 g/dL — ABNORMAL LOW (ref 13.0–17.0)
Immature Granulocytes: 1 %
Lymphocytes Relative: 14 %
Lymphs Abs: 2.9 10*3/uL (ref 0.7–4.0)
MCH: 26.8 pg (ref 26.0–34.0)
MCHC: 31 g/dL (ref 30.0–36.0)
MCV: 86.4 fL (ref 80.0–100.0)
Monocytes Absolute: 2 10*3/uL — ABNORMAL HIGH (ref 0.1–1.0)
Monocytes Relative: 10 %
Neutro Abs: 15.2 10*3/uL — ABNORMAL HIGH (ref 1.7–7.7)
Neutrophils Relative %: 75 %
Platelets: 304 10*3/uL (ref 150–400)
RBC: 4.26 MIL/uL (ref 4.22–5.81)
RDW: 17.2 % — ABNORMAL HIGH (ref 11.5–15.5)
WBC: 20.3 10*3/uL — ABNORMAL HIGH (ref 4.0–10.5)
nRBC: 0 % (ref 0.0–0.2)

## 2020-09-16 LAB — COMPREHENSIVE METABOLIC PANEL
ALT: 20 U/L (ref 0–44)
AST: 17 U/L (ref 15–41)
Albumin: 3.6 g/dL (ref 3.5–5.0)
Alkaline Phosphatase: 76 U/L (ref 38–126)
Anion gap: 11 (ref 5–15)
BUN: 16 mg/dL (ref 6–20)
CO2: 25 mmol/L (ref 22–32)
Calcium: 8.3 mg/dL — ABNORMAL LOW (ref 8.9–10.3)
Chloride: 101 mmol/L (ref 98–111)
Creatinine, Ser: 0.91 mg/dL (ref 0.61–1.24)
GFR, Estimated: >60 mL/min (ref >60)
Glucose, Bld: 93 mg/dL (ref 70–99)
Potassium: 4.1 mmol/L (ref 3.5–5.1)
Sodium: 137 mmol/L (ref 135–145)
Total Bilirubin: 0.5 mg/dL (ref 0.3–1.2)
Total Protein: 6.2 g/dL — ABNORMAL LOW (ref 6.5–8.1)

## 2020-09-16 LAB — PHOSPHORUS: Phosphorus: 3.4 mg/dL (ref 2.5–4.6)

## 2020-09-16 LAB — SURGICAL PATHOLOGY

## 2020-09-16 MED ORDER — TRAMADOL HCL 50 MG PO TABS
100.0000 mg | ORAL_TABLET | Freq: Four times a day (QID) | ORAL | 0 refills | Status: AC | PRN
Start: 2020-09-16 — End: 2020-09-21

## 2020-09-16 MED ORDER — PREDNISONE 20 MG PO TABS: ORAL_TABLET | ORAL | 0 refills | Status: AC

## 2020-09-16 MED ORDER — METOPROLOL TARTRATE 25 MG PO TABS: 25.0000 mg | ORAL_TABLET | Freq: Two times a day (BID) | ORAL | 1 refills | Status: AC

## 2020-09-16 MED ORDER — HYDROMORPHONE HCL 1 MG/ML IJ SOLN
0.5000 mg | Freq: Two times a day (BID) | INTRAMUSCULAR | Status: DC | PRN
  Administered 2020-09-16: 0.5 mg via INTRAVENOUS
  Filled 2020-09-16: qty 0.5

## 2020-09-16 MED ORDER — CYANOCOBALAMIN 1000 MCG PO TABS: 1000.0000 ug | ORAL_TABLET | Freq: Every day | ORAL | Status: AC

## 2020-09-16 MED ORDER — VITAMIN B-12 1000 MCG PO TABS
1000.0000 ug | ORAL_TABLET | Freq: Every day | ORAL | Status: DC
  Administered 2020-09-16: 1000 ug via ORAL
  Filled 2020-09-16: qty 1

## 2020-09-16 MED ORDER — HYDROMORPHONE HCL 1 MG/ML IJ SOLN
0.5000 mg | INTRAMUSCULAR | Status: DC | PRN
  Administered 2020-09-16: 0.5 mg via INTRAVENOUS
  Filled 2020-09-16: qty 0.5

## 2020-09-16 MED ORDER — MESALAMINE 1.2 G PO TBEC: 4.8000 g | DELAYED_RELEASE_TABLET | Freq: Every day | ORAL | 1 refills | Status: AC

## 2020-09-16 MED ORDER — FOLIC ACID 1 MG PO TABS: 1.0000 mg | ORAL_TABLET | Freq: Every day | ORAL | Status: AC

## 2020-09-16 MED ORDER — DICYCLOMINE HCL 10 MG PO CAPS: 20.0000 mg | ORAL_CAPSULE | Freq: Three times a day (TID) | ORAL | 1 refills | Status: AC

## 2020-09-16 NOTE — Progress Notes (Signed)
    Progress Note   Subjective  Chief Complaint:Abdominal pain, History of Crohns  EGD and colonoscopy 09/15/2020.  EGD with normal esophagus, erythematous mucosa in the gastric body and normal duodenum.  Colonoscopy with entire examined colon normal, ulcerated ileocolonic anastomosis which was biopsied.  Today, the patient tells me he continues with some pain in the right lower quadrant, he is worried about his pain medications and going home with some so that he does not have to be in pain.   Objective   Vital signs in last 24 hours: Temp:  [97.9 F (36.6 C)-99.3 F (37.4 C)] 98.6 F (37 C) (11/12 0520) Pulse Rate:  [51-96] 73 (11/12 0520) Resp:  [8-18] 18 (11/12 0520) BP: (107-166)/(50-104) 166/96 (11/12 0520) SpO2:  [98 %-100 %] 99 % (11/12 0520) Weight:  [60 kg] 60 kg (11/11 1222) Last BM Date: 09/15/20 General:    white male in NAD Heart:  Regular rate and rhythm; no murmurs Lungs: Respirations even and unlabored, lungs CTA bilaterally Abdomen:  Soft, mild RLQ ttp and nondistended. Normal bowel sounds. Extremities:  Without edema. Neurologic:  Alert and oriented,  grossly normal neurologically. Psych:  Cooperative. Normal mood and affect.  Intake/Output from previous day: 11/11 0701 - 11/12 0700 In: 760 [P.O.:360; I.V.:400] Out: 0   Lab Results: Recent Labs    09/14/20 0513 09/15/20 0501 09/16/20 0448  WBC 20.5* 16.9* 20.3*  HGB 10.8* 10.1* 11.4*  HCT 34.2* 31.5* 36.8*  PLT 255 202 304   BMET Recent Labs    09/14/20 0513 09/15/20 0501 09/16/20 0448  NA 133* 136 137  K 3.4* 3.8 4.1  CL 99 101 101  CO2 27 28 25   GLUCOSE 106* 96 93  BUN 16 11 16   CREATININE 0.97 0.95 0.91  CALCIUM 8.1* 8.2* 8.3*   LFT Recent Labs    09/16/20 0448  PROT 6.2*  ALBUMIN 3.6  AST 17  ALT 20  ALKPHOS 76  BILITOT 0.5    Assessment / Plan:    Assessment: 1.  Crohn's/abdominal pain: Status post ileocecal resection and ileocolonic anastomosis, history of being on  Stelara, all for the past 3 months, doing better on IV Solu-Medrol 60 mg daily, EGD and colonoscopy yesterday with some ulceration at anastomosis and otherwise normal, there is felt to be a functional overlap with his pain  Plan: 1.  Patient underwent EGD and colonoscopy which were essentially unremarkable other than some ulceration at anastomosis, biopsies were taken and pathology is pending. 2.  It is that a lot of his pain has functional overlap.  Agree with decreasing pain medications and using Bentyl 3.  Please await further recommendations from Dr. later today.  We will sign off and await pathologies, we will alert the patient if this changes his recommendations.  Thank you for kind consultation.    LOS: 3 days   13/12/21  09/16/2020, 10:13 AM

## 2020-09-16 NOTE — Progress Notes (Signed)
Discharge instructions given to pt and all questions were answered.  

## 2020-09-16 NOTE — Discharge Summary (Signed)
Physician Discharge Summary  Raymond Huerta Raymond Huerta DOB: [birth year] DOA: 09/12/2020  PCP: Pcp, No  Admit date: 09/12/2020 Discharge date: 09/16/2020  Time spent: 50 minutes  Recommendations for Outpatient Follow-up:  1. Follow-up with primary gastroenterologist, Dr. Mitzi Huerta in 2 to 4 weeks. 2. Follow-up with PCP in 2 weeks.  On follow-up patient will need a basic metabolic profile done to follow-up on electrolytes and renal function.  Patient will also need a CBC done to follow-up on H&H.   Discharge Diagnoses:  Principal Problem:   Acute Crohn's disease (HCC) Active Problems:   IBD (inflammatory bowel disease)   Right lower quadrant abdominal pain   Folate deficiency   Low vitamin B12 level   Normocytic anemia   Anxiety and depression   Essential hypertension   Leukocytosis   Hyperglycemia   Nausea without vomiting   Abdominal pain   Crohn's disease with rectal bleeding (HCC)   Discharge Condition: Stable and improved.  Diet recommendation: Regular   Filed Weights   09/12/20 0100 09/15/20 0518 09/15/20 1222  Weight: 61.2 kg 60.5 kg 60 kg    History of present illness:  HPI per Dr.Sheikh Raymond Huerta is a 29 y.o. male with medical history significant of 29 year old male with PMHx Crohn's disease (Diagnosed by a GI doc in [place]), anxiety and depression, hypertension as well as other comorbidities presenting with abdominal pain, nausea and vomiting despite being started on steroid taper after being seen at [facility] on 11/3.  He has been having ongoing abdominal pain and bloody stools and states that he has been diagnosed with Crohn's disease previously and was on maintenance medication with Stelara but stopped taking it 3 months ago when his job changed.  He continues to have some rectal throbbing and 12 pound weight loss over the last week and a half and has had severe nausea and vomiting.  He has having 5-6 bowel movements a day and he has had some  bloody stools.  He feels that he has had more stress in his life especially with his divorce and taking care of his 47 and half-year-old daughter.  Continues to have abdominal pain in the right lower quadrant and was placed on prednisone taper when he was last seen at [facility] on 11 3 and discharged.  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 Patient thought to have persisting Crohn's flare by ER provider. Patient given 125mg  Solumedrol, started on IVF and transferred to [facility] for further management and evaluation.  GI consulted and resuming steroids on the patient.  Of note he has had several ER visits and he went to the ER 1015, 1028, 11 3 and he was seen by telemedicine on 07/07/2019 by [facility].  ED Course: In the ED he had basic blood work done and was given Solu-Medrol, ketamine, morphine and ondansetron and antiemetics.  Of note he denies any imaging done given that he just had a CT of the abdomen pelvis from 09/07/2020.  COVID-19 testing was negative via PCR.  Hospital Course:  1 acute Crohn's flare versus IBD Patient presented with nausea, vomiting, abdominal pain, bloody stools.  Patient noted to have recently been seen in the ED 10/28 and 11 /3 with similar complaints.  Patient prior history of hemicolectomy.  CT abdomen and pelvis done from 09/07/2020 with probable ileocecal resection and ileocolonic anastomosis in the right lower quadrant, no acute  GI process, post cholecystectomy with no biliary duct dilatation, stomach with moderate to marked distention but without perigastric stranding.  Patient still with ongoing lower abdominal pain requiring IV pain medication for management.  Patient with some complaints of nausea.  Patient denied any further emesis.  Patient had no further bloody bowel movements during the hospitalization. Patient was seen in consultation by GI who ordered a sed rate, CRP, fecal Calprotectin, GI  pathogen panel, C. difficile PCR which was pending. CRP 0.6.  Sed rate at 5.  Folate levels of 4.7.    Patient was placed on Bentyl 10 mg 3 times daily, Solu-Medrol 60 mg IV daily as recommended by GI, IV antiemetics, IV pain medication for severe pain.  Patient subsequently underwent upper endoscopy and colonoscopy 09/15/2020.  EGD showed normal esophagus, erythematous mucosa in the gastric body and normal duodenum.  Colonoscopy showed Talley normal colon, ulcerated ileocolonic anastomosis which was biopsied.  GI felt patient likely had a component of functional overlap contributing to his symptoms.  Alternative strategies to manage patient's chronic abdominal pain other than opioids was discussed with patient per GI.  Pathology results reviewed by GI showed focal chronic colitis at the anastomosis without granulomas.  Per GI other biopsies were negative for Crohn's and recommended adding mesalamine 4.8 g daily on discharge in addition to prednisone taper, Bentyl 20 mg 3 times daily, continuation of folic acid and vitamin B12 with close outpatient follow-up with patient's primary gastroenterologist, Dr. Archie Huerta.  Patient will also be discharged on a few pills of Ultram as needed for pain.  Patient was tolerating a diet by day of discharge.  Patient will be discharged in stable condition.   2.  Folate deficiency Likely secondary to problem #1.    Patient was placed on folic acid 1 mg daily which he will be discharged home on.   3.  Hypertension Patient maintained on home regimen of metoprolol 25 mg twice daily.   4.  Leukocytosis Likely reactive leukocytosis in the setting of steroids.  Patient with no signs or symptoms of infection.  Urinalysis done on admission nitrite negative leukocytes negative.  Patient with no respiratory symptoms.  Patient remained afebrile during the hospitalization.  Outpatient follow-up.   5.  Depression/anxiety Patient maintained on Vyvanse at reduced dose of  40 mg daily during the hospitalization as well as home regimen of Lexapro, Adderall.  Patient also maintained on home regimen of Ativan twice daily as needed.  Outpatient follow-up.  6.  Hyperglycemia Likely secondary to steroids.  Hemoglobin A1c 5.9.    7.  Normocytic anemia/low vitamin B12 Patient had presented with some rectal bleeding secondary to problem #1.  Patient denied any further rectal bleeding during the hospitalization.  Anemia panel with iron level of 96, TIBC of 392, ferritin of 11, folate of 4.7, vitamin B12 of 280.    Patient was placed on oral folic acid daily as well as vitamin B12 supplementation which will be discharged home on.  Hemoglobin stabilized at 11.4 by day of discharge.  Outpatient follow-up.   8.  Hypokalemia Repleted.  Potassium was 4.1 by day of discharge.    Procedures:  Upper endoscopy/colonoscopy per Dr. Orvan Falconer 09/15/2020  Consultations:  Gastroenterology: Dr. Orvan Falconer 09/12/2020  Discharge Exam: Vitals:   09/16/20 1508 09/16/20 1525  BP: (!) 172/105 (!) 172/97  Pulse: 70   Resp:    Temp:    SpO2: 96%     General: NAD Cardiovascular: RRR Respiratory: CTAB  Discharge Instructions  Discharge Instructions    Diet general   Complete by: As directed    Increase activity slowly   Complete by: As directed      Allergies as of 09/16/2020      Reactions   Bentyl [dicyclomine] Itching   Toradol [ketorolac Tromethamine] Itching   Zithromax [azithromycin] Nausea And Vomiting      Medication List    STOP taking these medications   oxyCODONE-acetaminophen 5-325 MG tablet Commonly known as: PERCOCET/ROXICET   sulfaSALAzine 500 MG tablet Commonly known as: AZULFIDINE   ustekinumab 90 MG/ML Sosy injection Commonly known as: STELARA     TAKE these medications   Adderall XR 15 MG 24 hr capsule Generic drug: amphetamine-dextroamphetamine Take 15 mg by mouth at bedtime.   amphetamine-dextroamphetamine 5 MG tablet Commonly  known as: ADDERALL Take 1 tablet by mouth daily.   cyanocobalamin 1000 MCG tablet Take 1 tablet (1,000 mcg total) by mouth daily. Start taking on: September 17, 2020   dicyclomine 10 MG capsule Commonly known as: BENTYL Take 2 capsules (20 mg total) by mouth 3 (three) times daily before meals.   escitalopram 10 MG tablet Commonly known as: LEXAPRO Take 10 mg by mouth daily.   folic acid 1 MG tablet Commonly known as: FOLVITE Take 1 tablet (1 mg total) by mouth daily. Start taking on: September 17, 2020   LORazepam 1 MG tablet Commonly known as: ATIVAN Take 1 mg by mouth daily as needed for anxiety.   Marinol 2.5 MG capsule Generic drug: dronabinol Take 2.5 mg by mouth 2 (two) times daily as needed (appetite).   mesalamine 1.2 g EC tablet Commonly known as: Lialda Take 4 tablets (4.8 g total) by mouth daily with breakfast.   metoCLOPramide 10 MG tablet Commonly known as: REGLAN Take 1 tablet (10 mg total) by mouth every 6 (six) hours as needed for nausea.   metoprolol tartrate 25 MG tablet Commonly known as: LOPRESSOR Take 1 tablet (25 mg total) by mouth 2 (two) times daily.   ondansetron 4 MG disintegrating tablet Commonly known as: Zofran ODT 4mg  ODT q4 hours prn nausea/vomit   predniSONE 20 MG tablet Commonly known as: DELTASONE Take 3 tablets (60 mg total) by mouth daily for 4 days, THEN 2 tablets (40 mg total) daily for 4 days, THEN 1 tablet (20 mg total) daily for 4 days. Start taking on: September 16, 2020 What changed:   medication strength  See the new instructions.   promethazine 25 MG suppository Commonly known as: PHENERGAN Place 1 suppository (25 mg total) rectally every 6 (six) hours as needed for nausea or vomiting.   traMADol 50 MG tablet Commonly known as: Ultram Take 2 tablets (100 mg total) by mouth every 6 (six) hours as needed for up to 5 days.   traZODone 100 MG tablet Commonly known as: DESYREL Take 50 mg by mouth at bedtime.    Vyvanse 60 MG capsule Generic drug: lisdexamfetamine Take 60 mg by mouth every morning.      Allergies  Allergen Reactions  . Bentyl [Dicyclomine] Itching  . Toradol [Ketorolac Tromethamine] Itching  . Zithromax [Azithromycin] Nausea And Vomiting    Follow-up Information    September 18, 2020, MD. Schedule an appointment as soon as possible for a visit in 2 week(s).   Specialties: Gastroenterology, Internal Medicine Why: F/U IN 2-4 WEEKS Contact information: [address] [place] [id number]        PCP. Schedule an appointment as soon  as possible for a visit in 2 week(s).                The results of significant diagnostics from this hospitalization (including imaging, microbiology, ancillary and laboratory) are listed below for reference.    Significant Diagnostic Studies: CT ABDOMEN PELVIS W CONTRAST  Result Date: 09/07/2020 CLINICAL DATA:  Lower quadrant abdominal pain in a 29 year old male EXAM: CT ABDOMEN AND PELVIS WITH CONTRAST TECHNIQUE: Multidetector CT imaging of the abdomen and pelvis was performed using the standard protocol following bolus administration of intravenous contrast. CONTRAST:  OMNIPAQUE IOHEXOL 300 MG/ML  SOLN COMPARISON:  08/20/2020 FINDINGS: Lower chest: Incidental imaging of the lung bases without consolidation or pleural effusion. Hepatobiliary: Liver is normal. Post cholecystectomy. Portal vein is patent. Hepatic veins are patent. Pancreas: Normal Spleen: Normal Adrenals/Urinary Tract: Normal appearance of the bilateral adrenal glands. Symmetric renal enhancement. No sign of hydronephrosis. Stomach/Bowel: Stomach distended with ingested contents. Small bowel is normal caliber, collapsed limiting assessment through much of its course. Signs of probable ileocecal resection and ileocolonic anastomosis in the RIGHT lower quadrant. No sign of bowel obstruction with positive contrast material passing well into  the colon on the current study. Vascular/Lymphatic: Patent abdominal vasculature without aneurysmal dilation. There is no gastrohepatic or hepatoduodenal ligament lymphadenopathy. No retroperitoneal or mesenteric lymphadenopathy. No pelvic sidewall lymphadenopathy. Reproductive: Heterogeneous prostate, nonspecific on CT. Similar heterogeneity seen on previous imaging studies. Other: No ascites.  No free air. Musculoskeletal: No acute bone finding. No destructive bone process. IMPRESSION: 1. Signs of probable ileocecal resection and ileocolonic anastomosis in the RIGHT lower quadrant. No acute gastrointestinal process. 2. Post cholecystectomy no biliary duct dilation. 3. Stomach with moderate to marked distension but without perigastric stranding. Correlate with any current symptoms of impaired gastric emptying. This may simply represent recent ingestion of a large meal. Electronically Signed   By: Donzetta Kohut M.D.   On: 09/07/2020 13:06   CT ABDOMEN PELVIS W CONTRAST  Result Date: 08/20/2020 CLINICAL DATA:  History of Crohn's disease with previous hemicolectomy, now with abdominal distention. EXAM: CT ABDOMEN AND PELVIS WITH CONTRAST TECHNIQUE: Multidetector CT imaging of the abdomen and pelvis was performed using the standard protocol following bolus administration of intravenous contrast. CONTRAST:  OMNIPAQUE IOHEXOL 300 MG/ML  SOLN COMPARISON:  05/03/2020; 04/18/2020 FINDINGS: Lower chest: Limited visualization of the lower thorax is negative for focal airspace opacity or pleural effusion. Hepatobiliary: Normal hepatic contour. There is a minimal amount of focal fatty infiltration adjacent to the fissure for ligamentum teres. No discrete hepatic lesions. Post cholecystectomy with similar appearing mild dilatation of the intrahepatic biliary tree, likely the sequela of biliary reservoir phenomena. No extrahepatic bili duct dilatation. No ascites. Pancreas: Normal appearance of the pancreas. Spleen:  Normal appearance of the spleen. Adrenals/Urinary Tract: There is symmetric enhancement of the bilateral kidneys. No evidence of nephrolithiasis. No urine obstruction or perinephric stranding. Normal appearance the bilateral adrenal glands. Normal appearance of the urinary bladder given degree of distention. Stomach/Bowel: Apparent circumferential wall thickening involving the descending and sigmoid colon (representative images 25, 39, 45, 61 and 64, series 5), potentially accentuated due to underdistention though conceivably an enteritis could have a similar appearance. No additional areas of discrete bowel wall thickening are identified. No evidence of perforation or definable/drainable fluid collection. No definitive evidence enteric fistula. Stable sequela of distal ileal resection without evidence of enteric obstruction. No additional discrete areas of bowel wall thickening are identified. Post appendectomy. No pneumoperitoneum, pneumatosis or portal venous  gas. Vascular/Lymphatic: Normal caliber the abdominal aorta. The major branch vessels of the abdominal aorta appear patent. No bulky retroperitoneal, mesenteric, pelvic or inguinal lymphadenopathy. Reproductive: Normal appearance the prostate gland. No free fluid the pelvic cul-de-sac. Other: Regional soft tissues appear normal. Musculoskeletal: No acute or aggressive osseous abnormalities. IMPRESSION: 1. Apparent circumferential wall thickening involving the descending and sigmoid colon, potentially accentuated due to underdistention though conceivably an enteritis (such as Crohn's colitis given clinical history) could have a similar appearance. No evidence of perforation or definable/drainable fluid collection. 2. Stable sequela of distal ileal resection without evidence of enteric obstruction. 3. Post cholecystectomy and appendectomy. Electronically Signed   By: Simonne Come M.D.   On: 08/20/2020 11:51    Microbiology: Recent Results (from the past 240  hour(s))  Respiratory Panel by RT PCR (Flu A&B, Covid) - Nasopharyngeal Swab     Status: None   Collection Time: 09/12/20  3:08 AM   Specimen: Nasopharyngeal Swab  Result Value Ref Range Status   SARS Coronavirus 2 by RT PCR NEGATIVE NEGATIVE Final    Comment: (NOTE) SARS-CoV-2 target nucleic acids are NOT DETECTED.  The SARS-CoV-2 RNA is generally detectable in upper respiratoy specimens during the acute phase of infection. The lowest concentration of SARS-CoV-2 viral copies this assay can detect is 131 copies/mL. A negative result does not preclude SARS-Cov-2 infection and should not be used as the sole basis for treatment or other patient management decisions. A negative result may occur with  improper specimen collection/handling, submission of specimen other than nasopharyngeal swab, presence of viral mutation(s) within the areas targeted by this assay, and inadequate number of viral copies (<131 copies/mL). A negative result must be combined with clinical observations, patient history, and epidemiological information. The expected result is Negative.  Fact Sheet for Patients:  https://www.moore.com/  Fact Sheet for Healthcare Providers:  https://www.young.biz/  This test is no t yet approved or cleared by the [place] FDA and  has been authorized for detection and/or diagnosis of SARS-CoV-2 by FDA under an Emergency Use Authorization (EUA). This EUA will remain  in effect (meaning this test can be used) for the duration of the COVID-19 declaration under Section 564(b)(1) of the Act, 21 U.S.C. section 360bbb-3(b)(1), unless the authorization is terminated or revoked sooner.     Influenza A by PCR NEGATIVE NEGATIVE Final   Influenza B by PCR NEGATIVE NEGATIVE Final    Comment: (NOTE) The Xpert Xpress SARS-CoV-2/FLU/RSV assay is intended as an aid in  the diagnosis of influenza from Nasopharyngeal swab specimens and  should not  be used as a sole basis for treatment. Nasal washings and  aspirates are unacceptable for Xpert Xpress SARS-CoV-2/FLU/RSV  testing.  Fact Sheet for Patients: https://www.moore.com/  Fact Sheet for Healthcare Providers: https://www.young.biz/  This test is not yet approved or cleared by the [place] FDA and  has been authorized for detection and/or diagnosis of SARS-CoV-2 by  FDA under an Emergency Use Authorization (EUA). This EUA will remain  in effect (meaning this test can be used) for the duration of the  Covid-19 declaration under Section 564(b)(1) of the Act, 21  U.S.C. section 360bbb-3(b)(1), unless the authorization is  terminated or revoked. Performed at [facility], [address] [place], [place] [address]   Calprotectin, Fecal     Status: None   Collection Time: 09/13/20  5:03 PM   Specimen: STOOL  Result Value Ref Range Status   Calprotectin, Fecal 72 0 - 120 ug/g  Final    Comment: (NOTE) Concentration     Interpretation   Follow-Up <16 - 50 ug/g     Normal           None >50 -120 ug/g     Borderline       Re-evaluate in 4-6 weeks    >120 ug/g     Abnormal         Repeat as clinically                                   indicated Performed At: [facility] [address] [place], [place] [id number] Jolene Schimke MD WU:[id number]      Labs: Basic Metabolic Panel: Recent Labs  Lab 09/12/20 0121 09/13/20 0532 09/14/20 0513 09/15/20 0501 09/16/20 0448  NA 139 136 133* 136 137  K 4.2 4.0 3.4* 3.8 4.1  CL 100 103 99 101 101  CO2 GLUCOSE 129* 105* 106* 96 93  BUN CREATININE 0.76 0.90 0.97 0.95 0.91  CALCIUM 9.1 8.5* 8.1* 8.2* 8.3*  MG  --  2.3 2.0 2.1 2.3  PHOS  --  3.8  --   --  3.4   Liver Function Tests: Recent Labs  Lab 09/12/20 0121 09/13/20 0532 09/14/20 0513 09/16/20 0448  AST ALT ALKPHOS 85 62 66 76  BILITOT 0.2* 0.6  0.4 0.5  PROT 6.8 5.5* 6.4* 6.2*  ALBUMIN 3.9 3.4* 3.7 3.6   Recent Labs  Lab 09/12/20 0121  LIPASE 30   No results for input(s): AMMONIA in the last 168 hours. CBC: Recent Labs  Lab 09/12/20 0121 09/13/20 0532 09/14/20 0513 09/15/20 0501 09/16/20 0448  WBC 12.6* 19.6* 20.5* 16.9* 20.3*  NEUTROABS 10.8* 16.1* 15.8* 13.1* 15.2*  HGB 11.8* 10.8* 10.8* 10.1* 11.4*  HCT 37.7* 34.3* 34.2* 31.5* 36.8*  MCV 84.2 84.1 83.8 83.8 86.4  PLT 228 242 255 202 304   Cardiac Enzymes: No results for input(s): CKTOTAL, CKMB, CKMBINDEX, TROPONINI in the last 168 hours. BNP: BNP (last 3 results) No results for input(s): BNP in the last 8760 hours.  ProBNP (last 3 results) No results for input(s): PROBNP in the last 8760 hours.  CBG: Recent Labs  Lab 09/14/20 0807 09/15/20 0819  GLUCAP 91 97       Signed:  Ramiro Harvest MD.  [facility] 09/16/2020, 3:47 PM

## 2020-09-16 NOTE — Anesthesia Postprocedure Evaluation (Signed)
Anesthesia Post Note  Patient: Raymond Huerta  Procedure(s) Performed: COLONOSCOPY WITH PROPOFOL (N/A ) ESOPHAGOGASTRODUODENOSCOPY (EGD) WITH PROPOFOL (N/A ) BIOPSY     Patient location during evaluation: PACU Anesthesia Type: MAC Level of consciousness: awake and alert Pain management: pain level controlled Vital Signs Assessment: post-procedure vital signs reviewed and stable Respiratory status: spontaneous breathing, nonlabored ventilation and respiratory function stable Cardiovascular status: stable and blood pressure returned to baseline Anesthetic complications: no   No complications documented.  Last Vitals:  Vitals:   09/15/20 2139 09/16/20 0520  BP: (!) 151/95 (!) 166/96  Pulse: 96 73  Resp: 17 18  Temp: 37.4 C 37 C  SpO2: 98% 99%    Last Pain:  Vitals:   09/16/20 0716  TempSrc:   PainSc: [place]

## 2020-09-19 ENCOUNTER — Encounter (HOSPITAL_COMMUNITY): Payer: Self-pay | Admitting: Gastroenterology

## 2020-09-20 ENCOUNTER — Emergency Department (HOSPITAL_BASED_OUTPATIENT_CLINIC_OR_DEPARTMENT_OTHER)
Admission: EM | Admit: 2020-09-20 | Discharge: 2020-09-20 | Disposition: A | Discharge status: Home / Self Care | Payer: Self-pay | Attending: Emergency Medicine | Admitting: Emergency Medicine

## 2020-09-20 ENCOUNTER — Other Ambulatory Visit: Payer: Self-pay

## 2020-09-20 ENCOUNTER — Emergency Department (HOSPITAL_BASED_OUTPATIENT_CLINIC_OR_DEPARTMENT_OTHER): Payer: Self-pay

## 2020-09-20 ENCOUNTER — Encounter (HOSPITAL_BASED_OUTPATIENT_CLINIC_OR_DEPARTMENT_OTHER): Payer: Self-pay | Admitting: *Deleted

## 2020-09-20 DIAGNOSIS — R000 Tachycardia, unspecified: Secondary | ICD-10-CM | POA: Insufficient documentation

## 2020-09-20 DIAGNOSIS — I10 Essential (primary) hypertension: Secondary | ICD-10-CM | POA: Insufficient documentation

## 2020-09-20 DIAGNOSIS — R5383 Other fatigue: Secondary | ICD-10-CM | POA: Insufficient documentation

## 2020-09-20 DIAGNOSIS — Z79899 Other long term (current) drug therapy: Secondary | ICD-10-CM | POA: Insufficient documentation

## 2020-09-20 DIAGNOSIS — R1031 Right lower quadrant pain: Secondary | ICD-10-CM

## 2020-09-20 DIAGNOSIS — K5900 Constipation, unspecified: Secondary | ICD-10-CM

## 2020-09-20 DIAGNOSIS — Z8719 Personal history of other diseases of the digestive system: Secondary | ICD-10-CM | POA: Insufficient documentation

## 2020-09-20 DIAGNOSIS — R110 Nausea: Secondary | ICD-10-CM

## 2020-09-20 LAB — CBC WITH DIFFERENTIAL/PLATELET
Abs Immature Granulocytes: 0.34 10*3/uL — ABNORMAL HIGH (ref 0.00–0.07)
Basophils Absolute: 0.1 10*3/uL (ref 0.0–0.1)
Basophils Relative: 1 %
Eosinophils Absolute: 0.4 10*3/uL (ref 0.0–0.5)
Eosinophils Relative: 3 %
HCT: 40.5 % (ref 39.0–52.0)
Hemoglobin: 12.8 g/dL — ABNORMAL LOW (ref 13.0–17.0)
Immature Granulocytes: 2 %
Lymphocytes Relative: 16 %
Lymphs Abs: 2.3 10*3/uL (ref 0.7–4.0)
MCH: 27.1 pg (ref 26.0–34.0)
MCHC: 31.6 g/dL (ref 30.0–36.0)
MCV: 85.8 fL (ref 80.0–100.0)
Monocytes Absolute: 1.4 10*3/uL — ABNORMAL HIGH (ref 0.1–1.0)
Monocytes Relative: 10 %
Neutro Abs: 9.5 10*3/uL — ABNORMAL HIGH (ref 1.7–7.7)
Neutrophils Relative %: 68 %
Platelets: 313 10*3/uL (ref 150–400)
RBC: 4.72 MIL/uL (ref 4.22–5.81)
RDW: 17.4 % — ABNORMAL HIGH (ref 11.5–15.5)
WBC: 14 10*3/uL — ABNORMAL HIGH (ref 4.0–10.5)
nRBC: 0 % (ref 0.0–0.2)

## 2020-09-20 LAB — URINALYSIS, ROUTINE W REFLEX MICROSCOPIC
Bilirubin Urine: NEGATIVE
Glucose, UA: NEGATIVE mg/dL
Hgb urine dipstick: NEGATIVE
Ketones, ur: NEGATIVE mg/dL
Leukocytes,Ua: NEGATIVE
Nitrite: NEGATIVE
Protein, ur: NEGATIVE mg/dL
Specific Gravity, Urine: 1.025 (ref 1.005–1.030)
pH: 6 (ref 5.0–8.0)

## 2020-09-20 LAB — COMPREHENSIVE METABOLIC PANEL
ALT: 16 U/L (ref 0–44)
AST: 17 U/L (ref 15–41)
Albumin: 4.7 g/dL (ref 3.5–5.0)
Alkaline Phosphatase: 86 U/L (ref 38–126)
Anion gap: 11 (ref 5–15)
BUN: 16 mg/dL (ref 6–20)
CO2: 28 mmol/L (ref 22–32)
Calcium: 9.5 mg/dL (ref 8.9–10.3)
Chloride: 103 mmol/L (ref 98–111)
Creatinine, Ser: 0.96 mg/dL (ref 0.61–1.24)
GFR, Estimated: >60 mL/min (ref >60)
Glucose, Bld: 76 mg/dL (ref 70–99)
Potassium: 4 mmol/L (ref 3.5–5.1)
Sodium: 142 mmol/L (ref 135–145)
Total Bilirubin: 0.2 mg/dL — ABNORMAL LOW (ref 0.3–1.2)
Total Protein: 8.2 g/dL — ABNORMAL HIGH (ref 6.5–8.1)

## 2020-09-20 LAB — LIPASE, BLOOD: Lipase: 36 U/L (ref 11–51)

## 2020-09-20 LAB — LACTIC ACID, PLASMA: Lactic Acid, Venous: 2 mmol/L (ref 0.5–1.9)

## 2020-09-20 MED ORDER — HYDROMORPHONE HCL 1 MG/ML IJ SOLN
1.0000 mg | Freq: Once | INTRAMUSCULAR | Status: AC
  Administered 2020-09-20: 1 mg via INTRAVENOUS
  Filled 2020-09-20: qty 1

## 2020-09-20 MED ORDER — IOHEXOL 300 MG/ML  SOLN
100.0000 mL | Freq: Once | INTRAMUSCULAR | Status: AC | PRN
  Administered 2020-09-20: 80 mL via INTRAVENOUS

## 2020-09-20 MED ORDER — PROMETHAZINE HCL 25 MG/ML IJ SOLN
25.0000 mg | Freq: Once | INTRAMUSCULAR | Status: AC
  Administered 2020-09-20: 25 mg via INTRAVENOUS
  Filled 2020-09-20: qty 1

## 2020-09-20 MED ORDER — SODIUM CHLORIDE 0.9 % IV BOLUS
1000.0000 mL | Freq: Once | INTRAVENOUS | Status: AC
  Administered 2020-09-20: 1000 mL via INTRAVENOUS

## 2020-09-20 MED ORDER — PROMETHAZINE HCL 25 MG RE SUPP: 25.0000 mg | Freq: Four times a day (QID) | RECTAL | 0 refills | Status: AC | PRN

## 2020-09-20 NOTE — ED Triage Notes (Signed)
Having abd pain, has hx of crohns, recently had a colonoscopy at [facility]. Pain is getting worse. Having black tarry stool since yesterday

## 2020-09-20 NOTE — ED Provider Notes (Signed)
[facility] EMERGENCY DEPARTMENT Provider Note   CSN: [id number] Arrival date & time: 09/20/20  1526     History Chief Complaint  Patient presents with  . Abdominal Pain    Raymond Huerta is a 28 y.o. male.  The history is provided by the patient, a significant other and medical records. No language interpreter was used.  Abdominal Pain Pain location:  RUQ and RLQ Pain radiates to:  RLQ Pain severity:  Severe Onset quality:  Gradual Duration:  2 days Timing:  Constant Progression:  Worsening Chronicity:  Recurrent Context: previous surgery   Relieved by:  Nothing Worsened by:  Palpation Associated symptoms: fatigue and nausea   Associated symptoms: no chest pain, no chills, no constipation, no cough, no diarrhea, no dysuria, no fever, no shortness of breath and no vomiting   Risk factors: multiple surgeries and recent hospitalization        Past Medical History:  Diagnosis Date  . Crohn disease (HCC)   . 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 IBD (inflammatory bowel disease) 09/16/2020  . Crohn's disease with rectal bleeding (HCC)   . Nausea without vomiting   . Abdominal pain   . Folate deficiency 09/13/2020  . Low vitamin B12 level 09/13/2020  . Normocytic anemia 09/13/2020  . Anxiety and depression 09/13/2020  . Essential hypertension 09/13/2020  . Leukocytosis 09/13/2020  . Hyperglycemia 09/13/2020  . Acute Crohn's disease (HCC) 09/12/2020  . Right lower quadrant abdominal pain     Past Surgical History:  Procedure Laterality Date  . ABDOMINAL SURGERY    . BIOPSY  09/15/2020   Procedure: BIOPSY;  Surgeon: Tressia Danas, MD;  Location: [facility];  Service: Gastroenterology;;  . COLONOSCOPY WITH PROPOFOL N/A 09/15/2020   Procedure: COLONOSCOPY WITH PROPOFOL;  Surgeon: Tressia Danas, MD;  Location: [facility];  Service: Gastroenterology;  Laterality: N/A;  . ESOPHAGOGASTRODUODENOSCOPY  (EGD) WITH PROPOFOL N/A 09/15/2020   Procedure: ESOPHAGOGASTRODUODENOSCOPY (EGD) WITH PROPOFOL;  Surgeon: Tressia Danas, MD;  Location: [facility];  Service: Gastroenterology;  Laterality: N/A;  . WRIST SURGERY         History reviewed. No pertinent family history.  Social History   Tobacco Use  . Smoking status: Never Smoker  . Smokeless tobacco: Never Used  Substance Use Topics  . Alcohol use: Never  . Drug use: Never    Home Medications Prior to Admission medications   Medication Sig Start Date End Date Taking? Authorizing Provider  ADDERALL XR 15 MG 24 hr capsule Take 15 mg by mouth at bedtime.  07/11/20   [provider]  amphetamine-dextroamphetamine (ADDERALL) 5 MG tablet Take 1 tablet by mouth daily. 07/14/20   [provider]  dicyclomine (BENTYL) 10 MG capsule Take 2 capsules (20 mg total) by mouth 3 (three) times daily before meals. 09/16/20 03/15/21  Rodolph Bong, MD  escitalopram (LEXAPRO) 10 MG tablet Take 10 mg by mouth daily. 07/14/20   [provider]  folic acid (FOLVITE) 1 MG tablet Take 1 tablet (1 mg total) by mouth daily. 09/17/20   Rodolph Bong, MD  LORazepam (ATIVAN) 1 MG tablet Take 1 mg by mouth daily as needed for anxiety.  06/04/20   [provider]  MARINOL 2.5 MG capsule Take 2.5 mg by mouth 2 (two) times daily as needed (appetite).  07/14/20   [provider]  mesalamine (LIALDA) 1.2 g EC tablet Take 4 tablets (4.8 g total) by  mouth daily with breakfast. 09/16/20   Rodolph Bong, MD  metoCLOPramide (REGLAN) 10 MG tablet Take 1 tablet (10 mg total) by mouth every 6 (six) hours as needed for nausea. 09/07/20   Khatri, Hina, PA-C  metoprolol tartrate (LOPRESSOR) 25 MG tablet Take 1 tablet (25 mg total) by mouth 2 (two) times daily. 09/16/20   Rodolph Bong, MD  ondansetron (ZOFRAN ODT) 4 MG disintegrating tablet  ODT q4 hours prn nausea/vomit 09/01/20   Melene Plan, DO  predniSONE (DELTASONE)  20 MG tablet Take 3 tablets (60 mg total) by mouth daily for 4 days, THEN 2 tablets (40 mg total) daily for 4 days, THEN 1 tablet (20 mg total) daily for 4 days. 09/16/20 09/28/20  Rodolph Bong, MD  promethazine (PHENERGAN) 25 MG suppository Place 1 suppository (25 mg total) rectally every 6 (six) hours as needed for nausea or vomiting. 04/23/20   Alvira Monday, MD  traMADol (ULTRAM) 50 MG tablet Take 2 tablets (100 mg total) by mouth every 6 (six) hours as needed for up to 5 days. 09/16/20 09/21/20  Rodolph Bong, MD  traZODone (DESYREL) 100 MG tablet Take 50 mg by mouth at bedtime.  07/14/20   [provider]  vitamin B-12 1000 MCG tablet Take 1 tablet (1,000 mcg total) by mouth daily. 09/17/20   Rodolph Bong, MD  VYVANSE 60 MG capsule Take 60 mg by mouth every morning. 01/29/20   [provider]    Allergies    Bentyl [dicyclomine], Toradol [ketorolac tromethamine], and Zithromax [azithromycin]  Review of Systems   Review of Systems  Constitutional: Positive for fatigue. Negative for chills, diaphoresis and fever.  HENT: Negative for congestion.   Respiratory: Negative for cough, choking, chest tightness, shortness of breath and wheezing.   Cardiovascular: Negative for chest pain, palpitations and leg swelling.  Gastrointestinal: Positive for abdominal pain, blood in stool and nausea. Negative for abdominal distention, constipation, diarrhea and vomiting.  Genitourinary: Negative for dysuria, flank pain and frequency.  Musculoskeletal: Negative for back pain, neck pain and neck stiffness.  Neurological: Negative for light-headedness and headaches.  Psychiatric/Behavioral: Negative for agitation and confusion.  All other systems reviewed and are negative.   Physical Exam Updated Vital Signs BP (!) 153/114 (BP Location: Left Arm)   Temp 98.1 F (36.7 C) (Oral)   Resp 20   SpO2 100%   Physical Exam Vitals and nursing note reviewed.  Constitutional:       General: He is not in acute distress.    Appearance: He is well-developed. He is not ill-appearing, toxic-appearing or diaphoretic.  HENT:     Head: Normocephalic and atraumatic.     Mouth/Throat:     Mouth: Mucous membranes are dry.     Pharynx: No pharyngeal swelling, oropharyngeal exudate or posterior oropharyngeal erythema.  Eyes:     Extraocular Movements: Extraocular movements intact.     Conjunctiva/sclera: Conjunctivae normal.  Cardiovascular:     Rate and Rhythm: Regular rhythm. Tachycardia present.     Heart sounds: No murmur heard.   Pulmonary:     Effort: Pulmonary effort is normal. No respiratory distress.     Breath sounds: Normal breath sounds. No wheezing, rhonchi or rales.  Chest:     Chest wall: No tenderness.  Abdominal:     General: Abdomen is flat. A surgical scar is present. Bowel sounds are normal. There is no distension.     Palpations: Abdomen is soft.     Tenderness: There  is abdominal tenderness in the right upper quadrant and right lower quadrant. There is no right CVA tenderness, left CVA tenderness, guarding or rebound.    Musculoskeletal:     Cervical back: Neck supple.  Skin:    General: Skin is warm and dry.     Capillary Refill: Capillary refill takes less than 2 seconds.  Neurological:     General: No focal deficit present.     Mental Status: He is alert.  Psychiatric:        Mood and Affect: Mood normal.     ED Results / Procedures / Treatments   Labs (all labs ordered are listed, but only abnormal results are displayed) Labs Reviewed  CBC WITH DIFFERENTIAL/PLATELET - Abnormal; Notable for the following components:      Result Value   WBC 14.0 (*)    Hemoglobin 12.8 (*)    RDW 17.4 (*)    Neutro Abs 9.5 (*)    Monocytes Absolute 1.4 (*)    Abs Immature Granulocytes 0.34 (*)    All other components within normal limits  COMPREHENSIVE METABOLIC PANEL - Abnormal; Notable for the following components:   Total Protein 8.2 (*)      Total Bilirubin 0.2 (*)    All other components within normal limits  LACTIC ACID, PLASMA - Abnormal; Notable for the following components:   Lactic Acid, Venous 2.0 (*)    All other components within normal limits  URINE CULTURE  LIPASE, BLOOD  URINALYSIS, ROUTINE W REFLEX MICROSCOPIC  LACTIC ACID, PLASMA    EKG None  Radiology CT ABDOMEN PELVIS W CONTRAST  Result Date: 09/20/2020 CLINICAL DATA:  Right lower quadrant pain with history of Crohn disease. Ileocecal ulcer that was biopsied last week. Rule out perforation, abscess, or other complication. EXAM: CT ABDOMEN AND PELVIS WITH CONTRAST TECHNIQUE: Multidetector CT imaging of the abdomen and pelvis was performed using the standard protocol following bolus administration of intravenous contrast. CONTRAST:  60mL OMNIPAQUE IOHEXOL 300 MG/ML  SOLN COMPARISON:  09/07/2020 FINDINGS: Lower chest: Clear lung bases. Normal heart size without pericardial or pleural effusion. Hepatobiliary: Normal liver. Cholecystectomy, without biliary ductal dilatation. Pancreas: Normal, without mass or ductal dilatation. Spleen: Normal in size, without focal abnormality. Adrenals/Urinary Tract: Normal adrenal glands. Normal kidneys, without hydronephrosis. Normal urinary bladder. Stomach/Bowel: Normal stomach, without wall thickening. Colonic stool burden suggests constipation. Surgical changes of terminal ileal resection and anastomosis. Normal neoterminal ileum, including on 55/2. Otherwise normal small bowel. Vascular/Lymphatic: Normal caliber of the aorta and branch vessels. No abdominopelvic adenopathy. Reproductive: Normal prostate. Other: No significant free fluid.  No free intraperitoneal air. Musculoskeletal: No sacroiliitis. IMPRESSION: 1. Status post terminal ileal resection and anastomosis, without acute complication. 2. Possible constipation. Electronically Signed   By: Jeronimo Greaves M.D.   On: 09/20/2020 18:38    Procedures Procedures (including  critical care time)  Medications Ordered in ED Medications  sodium chloride 0.9 % bolus 1,000 mL (0 mLs Intravenous Stopped 09/20/20 1745)  HYDROmorphone (DILAUDID) injection 1 mg (1 mg Intravenous Given 09/20/20 1626)  promethazine (PHENERGAN) injection 25 mg (25 mg Intravenous Given 09/20/20 1620)  HYDROmorphone (DILAUDID) injection 1 mg (1 mg Intravenous Given 09/20/20 1745)  iohexol (OMNIPAQUE) 300 MG/ML solution 100 mL (80 mLs Intravenous Contrast Given 09/20/20 1807)    ED Course  I have reviewed the triage vital signs and the nursing notes.  Pertinent labs & imaging results that were available during my care of the patient were reviewed by me and considered in  my medical decision making (see chart for details).    MDM Rules/Calculators/A&P                          Previn Jian is a 29 y.o. male with a past medical history significant for prior Crohn's disease status post prior colon surgery and recent colonoscopy last week with discovery of ileocecal ulceration with active colitis, hypertension, anxiety, depression, and chronic rectal bleeding who presents with worsened right lower quadrant abdominal pain, nausea, fatigue, and dark stools.  Patient reports that he called his gastroenterology team who told him to come to the emergency department for evaluation.  He says that he was discharged on Friday, 5 days ago.  He reports that over the weekend, his right lower abdominal pain started to improve but then yesterday it acutely worsened.  He describes as an 8 out of 10 and bring him to tears in severity.  He reports he is also had dark tarry stool similar when he had GI bleeding in the past.  He reports nausea and has had decreased appetite.  He is feeling dehydrated.  He denies any diarrhea or constipation.  He denies any urinary changes.  He reports his pain is in his right lower quadrant and right hemiabdomen and feels worse than it was before he had his procedures last week and was  admitted.  He reports no fevers or chills.  Denies any chest pain or shortness of breath.  Denies any URI symptoms.  Denies any back pain or recent trauma.  No other complaints.  He reports he has been taking all of his medications including the steroids.  On exam, patient does have right lower quadrant tenderness.  Bowel sounds were appreciated.  Lungs were clear and chest was nontender.  Back and flanks nontender.  He denies any groin symptoms and defer GU exam initially.  Otherwise exam is unremarkable.  Clinically I do suspect we need to do a CT scan to look for a post procedure complication such as perforation, abscess, obstruction, or worsened colitis despite treatment.  We discussed that he has had 2 previous CT scans over the last month and a half and although this is not ideal, his GI team reported told him to come in for evaluation and to look for complications after his colonoscopy and endoscopy.  We will give him some pain medicine, nausea medicine and fluids initially.  We will get screening labs and urinalysis.  We will get a CT scan to look for other abnormality.  If work-up is reassuring, anticipate discharge to follow-up with his GI team for further management.     CT scan showed constipation and stool burden but otherwise no evidence of acute colitis, perforation, or abscess.  No evidence of complication from recent procedure.  Patient was encouraged to maintain hydration and follow-up with his gastroenterologist in the next few days which is scheduled to see.  Other work-up was consistent with dehydration and constipation.  He had some improvement with pain with the pain medicine however I am concerned that continued narcotic pain medicine use at home will contribute to worsen constipation.  We had a long discussion about this and given prescription for nausea medication to maintain hydration but will avoid narcotics at this time.  Patient agrees.  Patient discharged in good condition  with understanding return precautions and follow-up.   Final Clinical Impression(s) / ED Diagnoses Final diagnoses:  Right lower quadrant abdominal pain  Constipation,  unspecified constipation type  Nausea    Rx / DC Orders ED Discharge Orders         Ordered    promethazine (PHENERGAN) 25 MG suppository  Every 6 hours PRN        09/20/20 2151         Clinical Impression: 1. Right lower quadrant abdominal pain   2. Constipation, unspecified constipation type   3. Nausea     Disposition: Discharge  Condition: Good  I have discussed the results, Dx and Tx plan with the pt(& family if present). He/she/they expressed understanding and agree(s) with the plan. Discharge instructions discussed at great length. Strict return precautions discussed and pt &/or family have verbalized understanding of the instructions. No further questions at time of discharge.    Discharge Medication List as of 09/20/2020  9:53 PM    START taking these medications   Details  !! promethazine (PHENERGAN) 25 MG suppository Place 1 suppository (25 mg total) rectally every 6 (six) hours as needed for nausea or vomiting., Starting Tue 09/20/2020, Print     !! - Potential duplicate medications found. Please discuss with provider.      Follow Up: Your gastroenterologist this week        Ida Milbrath, Canary Brim, MD 09/20/20 [phone]

## 2020-09-20 NOTE — Discharge Instructions (Signed)
Your imaging today showed a moderate stool burden and I do feel this is causing pain in your previously ulcerated and irritated right lower colon.  Please try to push hydration and use your home medications including nausea medicine to help with passing bowel movements.  Please rest.  Please follow-up with your GI doctor this week and if any symptoms change or worsen acutely, please return to the nearest emergency department.

## 2020-09-22 LAB — URINE CULTURE: Culture: NO GROWTH

## 2020-09-25 ENCOUNTER — Emergency Department (HOSPITAL_BASED_OUTPATIENT_CLINIC_OR_DEPARTMENT_OTHER): Payer: Self-pay

## 2020-09-25 ENCOUNTER — Encounter (HOSPITAL_BASED_OUTPATIENT_CLINIC_OR_DEPARTMENT_OTHER): Payer: Self-pay | Admitting: Emergency Medicine

## 2020-09-25 ENCOUNTER — Other Ambulatory Visit: Payer: Self-pay

## 2020-09-25 ENCOUNTER — Emergency Department (HOSPITAL_BASED_OUTPATIENT_CLINIC_OR_DEPARTMENT_OTHER)
Admission: EM | Admit: 2020-09-25 | Discharge: 2020-09-25 | Disposition: A | Discharge status: Home / Self Care | Payer: Self-pay | Attending: Emergency Medicine | Admitting: Emergency Medicine

## 2020-09-25 DIAGNOSIS — K5900 Constipation, unspecified: Secondary | ICD-10-CM | POA: Insufficient documentation

## 2020-09-25 DIAGNOSIS — Z79899 Other long term (current) drug therapy: Secondary | ICD-10-CM | POA: Insufficient documentation

## 2020-09-25 DIAGNOSIS — R109 Unspecified abdominal pain: Secondary | ICD-10-CM

## 2020-09-25 DIAGNOSIS — I10 Essential (primary) hypertension: Secondary | ICD-10-CM | POA: Insufficient documentation

## 2020-09-25 LAB — COMPREHENSIVE METABOLIC PANEL
ALT: 16 U/L (ref 0–44)
AST: 21 U/L (ref 15–41)
Albumin: 3.8 g/dL (ref 3.5–5.0)
Alkaline Phosphatase: 67 U/L (ref 38–126)
Anion gap: 8 (ref 5–15)
BUN: 19 mg/dL (ref 6–20)
CO2: 22 mmol/L (ref 22–32)
Calcium: 8.4 mg/dL — ABNORMAL LOW (ref 8.9–10.3)
Chloride: 105 mmol/L (ref 98–111)
Creatinine, Ser: 0.98 mg/dL (ref 0.61–1.24)
GFR, Estimated: >60 mL/min (ref >60)
Glucose, Bld: 95 mg/dL (ref 70–99)
Potassium: 4.2 mmol/L (ref 3.5–5.1)
Sodium: 135 mmol/L (ref 135–145)
Total Bilirubin: 0.5 mg/dL (ref 0.3–1.2)
Total Protein: 6.6 g/dL (ref 6.5–8.1)

## 2020-09-25 LAB — CBC WITH DIFFERENTIAL/PLATELET
Abs Immature Granulocytes: 0.04 10*3/uL (ref 0.00–0.07)
Basophils Absolute: 0.1 10*3/uL (ref 0.0–0.1)
Basophils Relative: 1 %
Eosinophils Absolute: 0.2 10*3/uL (ref 0.0–0.5)
Eosinophils Relative: 3 %
HCT: 33.3 % — ABNORMAL LOW (ref 39.0–52.0)
Hemoglobin: 10.7 g/dL — ABNORMAL LOW (ref 13.0–17.0)
Immature Granulocytes: 1 %
Lymphocytes Relative: 15 %
Lymphs Abs: 1.3 10*3/uL (ref 0.7–4.0)
MCH: 26.8 pg (ref 26.0–34.0)
MCHC: 32.1 g/dL (ref 30.0–36.0)
MCV: 83.5 fL (ref 80.0–100.0)
Monocytes Absolute: 1.1 10*3/uL — ABNORMAL HIGH (ref 0.1–1.0)
Monocytes Relative: 13 %
Neutro Abs: 5.6 10*3/uL (ref 1.7–7.7)
Neutrophils Relative %: 67 %
Platelets: 272 10*3/uL (ref 150–400)
RBC: 3.99 MIL/uL — ABNORMAL LOW (ref 4.22–5.81)
RDW: 17.4 % — ABNORMAL HIGH (ref 11.5–15.5)
WBC: 8.3 10*3/uL (ref 4.0–10.5)
nRBC: 0 % (ref 0.0–0.2)

## 2020-09-25 LAB — LIPASE, BLOOD: Lipase: 32 U/L (ref 11–51)

## 2020-09-25 MED ORDER — POLYETHYLENE GLYCOL 3350 17 G PO PACK: 17.0000 g | PACK | Freq: Every day | ORAL | 0 refills | Status: AC

## 2020-09-25 MED ORDER — LIDOCAINE HCL URETHRAL/MUCOSAL 2 % EX GEL
1.0000 "application " | Freq: Once | CUTANEOUS | Status: AC
  Administered 2020-09-25: 1 via TOPICAL
  Filled 2020-09-25: qty 11

## 2020-09-25 MED ORDER — HYDROMORPHONE HCL 1 MG/ML IJ SOLN
1.0000 mg | Freq: Once | INTRAMUSCULAR | Status: AC
  Administered 2020-09-25: 1 mg via INTRAVENOUS
  Filled 2020-09-25: qty 1

## 2020-09-25 MED ORDER — FLEET ENEMA 7-19 GM/118ML RE ENEM
1.0000 | ENEMA | Freq: Once | RECTAL | Status: AC
  Administered 2020-09-25: 1 via RECTAL
  Filled 2020-09-25: qty 1

## 2020-09-25 MED ORDER — MORPHINE SULFATE (PF) 4 MG/ML IV SOLN
8.0000 mg | Freq: Once | INTRAVENOUS | Status: AC
  Administered 2020-09-25: 8 mg via INTRAVENOUS
  Filled 2020-09-25: qty 2

## 2020-09-25 MED ORDER — FENTANYL CITRATE (PF) 100 MCG/2ML IJ SOLN
50.0000 ug | Freq: Once | INTRAMUSCULAR | Status: AC
  Administered 2020-09-25: 50 ug via INTRAVENOUS
  Filled 2020-09-25: qty 2

## 2020-09-25 MED ORDER — SODIUM CHLORIDE 0.9 % IV BOLUS
1000.0000 mL | Freq: Once | INTRAVENOUS | Status: AC
  Administered 2020-09-25: 1000 mL via INTRAVENOUS

## 2020-09-25 MED ORDER — LORAZEPAM 2 MG/ML IJ SOLN
0.5000 mg | Freq: Once | INTRAMUSCULAR | Status: AC
  Administered 2020-09-25: 0.5 mg via INTRAVENOUS
  Filled 2020-09-25: qty 1

## 2020-09-25 MED ORDER — ONDANSETRON HCL 4 MG/2ML IJ SOLN
4.0000 mg | Freq: Once | INTRAMUSCULAR | Status: AC
  Administered 2020-09-25: 4 mg via INTRAVENOUS
  Filled 2020-09-25: qty 2

## 2020-09-25 MED ORDER — FENTANYL CITRATE (PF) 100 MCG/2ML IJ SOLN
100.0000 ug | Freq: Once | INTRAMUSCULAR | Status: AC
  Administered 2020-09-25: 100 ug via INTRAVENOUS
  Filled 2020-09-25: qty 2

## 2020-09-25 NOTE — Discharge Instructions (Signed)
You can titrate the miralax and take 1 capful every 12 hours until you start having stools. Then decrease the amount of miralax to 1 capful and then 1/2 capful daily until you have pudding consistency stools.  Please follow up with your gastroenterologist within the next 5-7 days.  If you do not have a primary care provider, information for a healthcare clinic has been provided for you to make arrangements for follow up care. Please return to the ER sooner if you have any new or worsening symptoms, or if you have any of the following symptoms:  Abdominal pain that does not go away.  You have a fever.  You keep throwing up (vomiting).  The pain is felt only in portions of the abdomen. Pain in the right side could possibly be appendicitis. In an adult, pain in the left lower portion of the abdomen could be colitis or diverticulitis.  You pass bloody or black tarry stools.  There is bright red blood in the stool.  The constipation stays for more than 4 days.  There is belly (abdominal) or rectal pain.  You do not seem to be getting better.  You have any questions or concerns.

## 2020-09-25 NOTE — ED Notes (Signed)
Several (6-7) large pieces of stool noted in bedside commode when emptied; pt has been to hall bathroom x 2 after this; RN was unable to visualize results.

## 2020-09-25 NOTE — ED Provider Notes (Signed)
[facility] EMERGENCY DEPARTMENT Provider Note   CSN: [id number] Arrival date & time: 09/25/20  1030     History Chief Complaint  Patient presents with  . Abdominal Pain    Raymond Huerta is a 29 y.o. male.  HPI   Pt is a 29 y/o male with a h/o crohn's disease, kienbock's disease, who presents to the ED today for eval of RLQ abd pain. Pain started about a week ago. Pain is constant and severe in nature. He reports associated constipation. He was seen in the Ed last week and had a w/u including CT scan that showed constipation.  States he tried 1 capful of miralax at home for the last week without relief. States he has not had a BM for the entire week. States he tried to do a digital disimpaction this morning which was unsuccessful. Additionally, he reports NV that started a few days ago. Denies fevers.  Past Medical History:  Diagnosis Date  . Crohn disease (HCC)   . 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 IBD (inflammatory bowel disease) 09/16/2020  . Crohn's disease with rectal bleeding (HCC)   . Nausea without vomiting   . Abdominal pain   . Folate deficiency 09/13/2020  . Low vitamin B12 level 09/13/2020  . Normocytic anemia 09/13/2020  . Anxiety and depression 09/13/2020  . Essential hypertension 09/13/2020  . Leukocytosis 09/13/2020  . Hyperglycemia 09/13/2020  . Acute Crohn's disease (HCC) 09/12/2020  . Right lower quadrant abdominal pain     Past Surgical History:  Procedure Laterality Date  . ABDOMINAL SURGERY    . BIOPSY  09/15/2020   Procedure: BIOPSY;  Surgeon: Tressia Danas, MD;  Location: [facility];  Service: Gastroenterology;;  . COLONOSCOPY WITH PROPOFOL N/A 09/15/2020   Procedure: COLONOSCOPY WITH PROPOFOL;  Surgeon: Tressia Danas, MD;  Location: [facility];  Service: Gastroenterology;  Laterality: N/A;  . ESOPHAGOGASTRODUODENOSCOPY (EGD) WITH PROPOFOL N/A 09/15/2020   Procedure:  ESOPHAGOGASTRODUODENOSCOPY (EGD) WITH PROPOFOL;  Surgeon: Tressia Danas, MD;  Location: [facility];  Service: Gastroenterology;  Laterality: N/A;  . WRIST SURGERY         No family history on file.  Social History   Tobacco Use  . Smoking status: Never Smoker  . Smokeless tobacco: Never Used  Vaping Use  . Vaping Use: Never used  Substance Use Topics  . Alcohol use: Never  . Drug use: Never    Home Medications Prior to Admission medications   Medication Sig Start Date End Date Taking? Authorizing Provider  ADDERALL XR 15 MG 24 hr capsule Take 15 mg by mouth at bedtime.  07/11/20   [provider]  amphetamine-dextroamphetamine (ADDERALL) 5 MG tablet Take 1 tablet by mouth daily. 07/14/20   [provider]  dicyclomine (BENTYL) 10 MG capsule Take 2 capsules (20 mg total) by mouth 3 (three) times daily before meals. 09/16/20 03/15/21  Rodolph Bong, MD  escitalopram (LEXAPRO) 10 MG tablet Take 10 mg by mouth daily. 07/14/20   [provider]  folic acid (FOLVITE) 1 MG tablet Take 1 tablet (1 mg total) by mouth daily. 09/17/20   Rodolph Bong, MD  LORazepam (ATIVAN) 1 MG tablet Take 1 mg by mouth daily as needed for anxiety.  06/04/20   [provider]  MARINOL 2.5 MG capsule Take 2.5 mg by mouth 2 (two) times daily as needed (appetite).  07/14/20   [provider]  mesalamine Theodosia Blender)  1.2 g EC tablet Take 4 tablets (4.8 g total) by mouth daily with breakfast. 09/16/20   Rodolph Bong, MD  metoCLOPramide (REGLAN) 10 MG tablet Take 1 tablet (10 mg total) by mouth every 6 (six) hours as needed for nausea. 09/07/20   Khatri, Hina, PA-C  metoprolol tartrate (LOPRESSOR) 25 MG tablet Take 1 tablet (25 mg total) by mouth 2 (two) times daily. 09/16/20   Rodolph Bong, MD  ondansetron (ZOFRAN ODT) 4 MG disintegrating tablet 4mg  ODT q4 hours prn nausea/vomit 09/01/20   09/03/20, DO  polyethylene glycol (MIRALAX) 17 g packet Take 17 g  by mouth daily. Dissolve one cap full in solution (water, gatorade, etc.) and administer once cap-full daily. You may titrate up daily by 1 cap-full until the patient is having pudding consistency of stools. After the patient is able to start passing softer stools they will need to be on 1/2 cap-full daily for 2 weeks. 09/25/20   Larose Batres S, PA-C  predniSONE (DELTASONE) 20 MG tablet Take 3 tablets (60 mg total) by mouth daily for 4 days, THEN 2 tablets (40 mg total) daily for 4 days, THEN 1 tablet (20 mg total) daily for 4 days. 09/16/20 09/28/20  09/30/20, MD  promethazine (PHENERGAN) 25 MG suppository Place 1 suppository (25 mg total) rectally every 6 (six) hours as needed for nausea or vomiting. 04/23/20   04/25/20, MD  promethazine (PHENERGAN) 25 MG suppository Place 1 suppository (25 mg total) rectally every 6 (six) hours as needed for nausea or vomiting. 09/20/20   Tegeler, 09/22/20, MD  traZODone (DESYREL) 100 MG tablet Take 50 mg by mouth at bedtime.  07/14/20   [provider]  vitamin B-12 1000 MCG tablet Take 1 tablet (1,000 mcg total) by mouth daily. 09/17/20   09/19/20, MD  VYVANSE 60 MG capsule Take 60 mg by mouth every morning. 01/29/20   [provider]    Allergies    Bentyl [dicyclomine], Toradol [ketorolac tromethamine], and Zithromax [azithromycin]  Review of Systems   Review of Systems  Constitutional: Negative for chills and fever.  HENT: Negative for ear pain and sore throat.   Eyes: Negative for visual disturbance.  Respiratory: Negative for cough and shortness of breath.   Cardiovascular: Negative for chest pain.  Gastrointestinal: Positive for abdominal pain, constipation, nausea and vomiting. Negative for diarrhea.  Genitourinary: Negative for dysuria and hematuria.  Musculoskeletal: Negative for back pain.  Skin: Negative for rash.  Neurological: Negative for headaches.  All other systems reviewed and are  negative.   Physical Exam Updated Vital Signs BP (!) 149/95 (BP Location: Left Arm)   Pulse 94   Temp 98.6 F (37 C) (Oral)   Resp 20   Ht 5\' 11"  (1.803 m)   Wt 60 kg   SpO2 98%   BMI 18.45 kg/m   Physical Exam Vitals and nursing note reviewed.  Constitutional:      Appearance: He is well-developed.  HENT:     Head: Normocephalic and atraumatic.  Eyes:     Conjunctiva/sclera: Conjunctivae normal.  Cardiovascular:     Rate and Rhythm: Regular rhythm. Tachycardia present.     Heart sounds: No murmur heard.   Pulmonary:     Effort: Pulmonary effort is normal. No respiratory distress.     Breath sounds: Normal breath sounds. No wheezing, rhonchi or rales.  Abdominal:     General: Bowel sounds are normal.     Palpations: Abdomen  is soft.     Tenderness: There is generalized abdominal tenderness. There is guarding. There is no rebound.  Musculoskeletal:     Cervical back: Neck supple.  Skin:    General: Skin is warm and dry.  Neurological:     Mental Status: He is alert.     ED Results / Procedures / Treatments   Labs (all labs ordered are listed, but only abnormal results are displayed) Labs Reviewed  CBC WITH DIFFERENTIAL/PLATELET - Abnormal; Notable for the following components:      Result Value   RBC 3.99 (*)    Hemoglobin 10.7 (*)    HCT 33.3 (*)    RDW 17.4 (*)    Monocytes Absolute 1.1 (*)    All other components within normal limits  COMPREHENSIVE METABOLIC PANEL - Abnormal; Notable for the following components:   Calcium 8.4 (*)    All other components within normal limits  LIPASE, BLOOD    EKG None  Radiology DG Abd Portable 1 View  Result Date: 09/25/2020 CLINICAL DATA:  Abdominal pain, constipation EXAM: PORTABLE ABDOMEN - 1 VIEW COMPARISON:  CT 09/20/2020 FINDINGS: The bowel gas pattern is normal. Moderate-large volume of stool projects throughout the colon. Cholecystectomy clips. No radio-opaque calculi or other significant radiographic  abnormality are seen. IMPRESSION: 1. Moderate-large volume of stool projects throughout the colon. 2. Nonobstructive bowel gas pattern. Electronically Signed   By: Duanne Guess D.O.   On: 09/25/2020 14:57    Procedures Procedures (including critical care time)  Medications Ordered in ED Medications  sodium chloride 0.9 % bolus 1,000 mL (0 mLs Intravenous Stopped 09/25/20 1250)  ondansetron (ZOFRAN) injection 4 mg (4 mg Intravenous Given 09/25/20 1137)  fentaNYL (SUBLIMAZE) injection 50 mcg (50 mcg Intravenous Given 09/25/20 1211)  lidocaine (XYLOCAINE) 2 % jelly 1 application (1 application Topical Given 09/25/20 1137)  HYDROmorphone (DILAUDID) injection 1 mg (1 mg Intravenous Given 09/25/20 1257)  sodium phosphate (FLEET) 7-19 GM/118ML enema 1 enema (1 enema Rectal Given 09/25/20 1322)  HYDROmorphone (DILAUDID) injection 1 mg (1 mg Intravenous Given 09/25/20 1329)  morphine 4 MG/ML injection 8 mg (8 mg Intravenous Given 09/25/20 1416)  fentaNYL (SUBLIMAZE) injection 100 mcg (100 mcg Intravenous Given 09/25/20 1442)  LORazepam (ATIVAN) injection 0.5 mg (0.5 mg Intravenous Given 09/25/20 1441)    ED Course  I have reviewed the triage vital signs and the nursing notes.  Pertinent labs & imaging results that were available during my care of the patient were reviewed by me and considered in my medical decision making (see chart for details).    MDM Rules/Calculators/A&P                          29 year old male with history of Crohn's disease presenting for evaluation of abdominal pain.  Seen in the ED last week and had CT scan which demonstrated constipation.  States he tried taking MiraLAX for a week but has had no stool output and his pain is worsening.  Tried a fecal disimpaction this morning which was unsuccessful but did note some hard stool in the rectum.  Reviewed/interpreted labs CBC is without leukocytosis, mild anemia present which is stable CMP unremakable  Lipase  unremakable  Patient given IV fluids, antiemetics, pain medications.  Fecal disimpaction completed with chaperone present and there was some hard stool noted in the rectal vault but mostly of this was distal to what I was able to reach. There was a small amount  of output. Pt then given an enema and had several large stools while in the ED. He was still c/o pain and therefore an xray was completed with showed a normal bowel gas pattern without evidence of obstruction or perforation. It was completed after he had multiple BMs and still did show moderate to large volume of stool in the colon so I believe that he is just still significantly constipated. We will have him uptitrate his miralax and also discharge him home with some bentyl. He will need to f/u with GI. Advised on return precautions.    Final Clinical Impression(s) / ED Diagnoses Final diagnoses:  Abdominal pain, unspecified abdominal location  Constipation, unspecified constipation type    Rx / DC Orders ED Discharge Orders         Ordered    polyethylene glycol (MIRALAX) 17 g packet  Daily        09/25/20 [address] [place], PA-C 09/25/20 1554    Charlynne Pander, MD 09/25/20 2128

## 2020-09-25 NOTE — ED Triage Notes (Signed)
Pt seen here last week for constipation. Pt reports symptoms continues today with no relief.

## 2020-09-29 ENCOUNTER — Encounter (HOSPITAL_BASED_OUTPATIENT_CLINIC_OR_DEPARTMENT_OTHER): Payer: Self-pay

## 2020-09-29 ENCOUNTER — Emergency Department (HOSPITAL_BASED_OUTPATIENT_CLINIC_OR_DEPARTMENT_OTHER)
Admission: EM | Admit: 2020-09-29 | Discharge: 2020-09-30 | Disposition: A | Discharge status: Home / Self Care | Payer: Self-pay | Attending: Emergency Medicine | Admitting: Emergency Medicine

## 2020-09-29 ENCOUNTER — Other Ambulatory Visit: Payer: Self-pay

## 2020-09-29 DIAGNOSIS — R109 Unspecified abdominal pain: Secondary | ICD-10-CM | POA: Insufficient documentation

## 2020-09-29 DIAGNOSIS — K5900 Constipation, unspecified: Secondary | ICD-10-CM

## 2020-09-29 LAB — CBC WITH DIFFERENTIAL/PLATELET
Abs Immature Granulocytes: 0.01 10*3/uL (ref 0.00–0.07)
Basophils Absolute: 0.1 10*3/uL (ref 0.0–0.1)
Basophils Relative: 1 %
Eosinophils Absolute: 0.4 10*3/uL (ref 0.0–0.5)
Eosinophils Relative: 7 %
HCT: 30.8 % — ABNORMAL LOW (ref 39.0–52.0)
Hemoglobin: 9.8 g/dL — ABNORMAL LOW (ref 13.0–17.0)
Immature Granulocytes: 0 %
Lymphocytes Relative: 22 %
Lymphs Abs: 1.4 10*3/uL (ref 0.7–4.0)
MCH: 27 pg (ref 26.0–34.0)
MCHC: 31.8 g/dL (ref 30.0–36.0)
MCV: 84.8 fL (ref 80.0–100.0)
Monocytes Absolute: 1 10*3/uL (ref 0.1–1.0)
Monocytes Relative: 16 %
Neutro Abs: 3.4 10*3/uL (ref 1.7–7.7)
Neutrophils Relative %: 54 %
Platelets: 275 10*3/uL (ref 150–400)
RBC: 3.63 MIL/uL — ABNORMAL LOW (ref 4.22–5.81)
RDW: 17.1 % — ABNORMAL HIGH (ref 11.5–15.5)
WBC: 6.3 10*3/uL (ref 4.0–10.5)
nRBC: 0 % (ref 0.0–0.2)

## 2020-09-29 LAB — COMPREHENSIVE METABOLIC PANEL
ALT: 13 U/L (ref 0–44)
AST: 18 U/L (ref 15–41)
Albumin: 3.6 g/dL (ref 3.5–5.0)
Alkaline Phosphatase: 62 U/L (ref 38–126)
Anion gap: 9 (ref 5–15)
BUN: 16 mg/dL (ref 6–20)
CO2: 23 mmol/L (ref 22–32)
Calcium: 8.1 mg/dL — ABNORMAL LOW (ref 8.9–10.3)
Chloride: 103 mmol/L (ref 98–111)
Creatinine, Ser: 0.9 mg/dL (ref 0.61–1.24)
GFR, Estimated: >60 mL/min (ref >60)
Glucose, Bld: 125 mg/dL — ABNORMAL HIGH (ref 70–99)
Potassium: 3.9 mmol/L (ref 3.5–5.1)
Sodium: 135 mmol/L (ref 135–145)
Total Bilirubin: 0.5 mg/dL (ref 0.3–1.2)
Total Protein: 6.5 g/dL (ref 6.5–8.1)

## 2020-09-29 LAB — LIPASE, BLOOD: Lipase: 37 U/L (ref 11–51)

## 2020-09-29 MED ORDER — SODIUM CHLORIDE 0.9 % IV BOLUS (SEPSIS)
1000.0000 mL | Freq: Once | INTRAVENOUS | Status: AC
  Administered 2020-09-29: 1000 mL via INTRAVENOUS

## 2020-09-29 MED ORDER — ONDANSETRON HCL 4 MG/2ML IJ SOLN
4.0000 mg | Freq: Once | INTRAMUSCULAR | Status: AC
  Administered 2020-09-29: 4 mg via INTRAVENOUS
  Filled 2020-09-29: qty 2

## 2020-09-29 MED ORDER — MORPHINE SULFATE (PF) 4 MG/ML IV SOLN
4.0000 mg | Freq: Once | INTRAVENOUS | Status: AC
  Administered 2020-09-29: 4 mg via INTRAVENOUS
  Filled 2020-09-29: qty 1

## 2020-09-29 NOTE — ED Provider Notes (Signed)
TIME SEEN: 11:12 PM  CHIEF COMPLAINT: Abdominal pain  HPI: Patient is a 29 year old male with history of Crohn's disease who presents to the emergency department with complaints of abdominal pain.  This is his third visit to the emergency department since 16 November for similar symptoms.  Was diagnosed with constipation and reports taking MiraLAX twice daily.  States he was disimpacted in the emergency department but continues to have pain.  States he is vomiting.  No fevers.  ROS: See HPI Constitutional: no fever  Eyes: no drainage  ENT: no runny nose   Cardiovascular:  no chest pain  Resp: no SOB  GI: vomiting GU: no dysuria Integumentary: no rash  Allergy: no hives  Musculoskeletal: no leg swelling  Neurological: no slurred speech ROS otherwise negative  PAST MEDICAL HISTORY/PAST SURGICAL HISTORY:  Past Medical History:  Diagnosis Date  . Crohn disease (HCC)   . Kienbock's disease     MEDICATIONS:  Prior to Admission medications   Medication Sig Start Date End Date Taking? Authorizing Provider  ADDERALL XR 15 MG 24 hr capsule Take 15 mg by mouth at bedtime.  07/11/20   [provider]  amphetamine-dextroamphetamine (ADDERALL) 5 MG tablet Take 1 tablet by mouth daily. 07/14/20   [provider]  dicyclomine (BENTYL) 10 MG capsule Take 2 capsules (20 mg total) by mouth 3 (three) times daily before meals. 09/16/20 03/15/21  Rodolph Bong, MD  escitalopram (LEXAPRO) 10 MG tablet Take 10 mg by mouth daily. 07/14/20   [provider]  folic acid (FOLVITE) 1 MG tablet Take 1 tablet (1 mg total) by mouth daily. 09/17/20   Rodolph Bong, MD  LORazepam (ATIVAN) 1 MG tablet Take 1 mg by mouth daily as needed for anxiety.  06/04/20   [provider]  MARINOL 2.5 MG capsule Take 2.5 mg by mouth 2 (two) times daily as needed (appetite).  07/14/20   [provider]  mesalamine (LIALDA) 1.2 g EC tablet Take 4 tablets (4.8 g total) by mouth daily  with breakfast. 09/16/20   Rodolph Bong, MD  metoCLOPramide (REGLAN) 10 MG tablet Take 1 tablet (10 mg total) by mouth every 6 (six) hours as needed for nausea. 09/07/20   Khatri, Hina, PA-C  metoprolol tartrate (LOPRESSOR) 25 MG tablet Take 1 tablet (25 mg total) by mouth 2 (two) times daily. 09/16/20   Rodolph Bong, MD  ondansetron (ZOFRAN ODT) 4 MG disintegrating tablet 4mg  ODT q4 hours prn nausea/vomit 09/01/20   09/03/20, DO  polyethylene glycol (MIRALAX) 17 g packet Take 17 g by mouth daily. Dissolve one cap full in solution (water, gatorade, etc.) and administer once cap-full daily. You may titrate up daily by 1 cap-full until the patient is having pudding consistency of stools. After the patient is able to start passing softer stools they will need to be on 1/2 cap-full daily for 2 weeks. 09/25/20   Couture, Cortni S, PA-C  promethazine (PHENERGAN) 25 MG suppository Place 1 suppository (25 mg total) rectally every 6 (six) hours as needed for nausea or vomiting. 04/23/20   04/25/20, MD  promethazine (PHENERGAN) 25 MG suppository Place 1 suppository (25 mg total) rectally every 6 (six) hours as needed for nausea or vomiting. 09/20/20   Tegeler, 09/22/20, MD  traZODone (DESYREL) 100 MG tablet Take 50 mg by mouth at bedtime.  07/14/20   [provider]  vitamin B-12 1000 MCG tablet Take 1 tablet (1,000 mcg total) by mouth daily.  09/17/20   Rodolph Bong, MD  VYVANSE 60 MG capsule Take 60 mg by mouth every morning. 01/29/20   [provider]    ALLERGIES:  Allergies  Allergen Reactions  . Bentyl [Dicyclomine] Itching  . Toradol [Ketorolac Tromethamine] Itching  . Zithromax [Azithromycin] Nausea And Vomiting    SOCIAL HISTORY:  Social History   Tobacco Use  . Smoking status: Never Smoker  . Smokeless tobacco: Never Used  Substance Use Topics  . Alcohol use: Never    FAMILY HISTORY: No family history on file.  EXAM: BP 132/88 (BP  Location: Left Arm)   Pulse 90   Temp 98.8 F (37.1 C) (Oral)   Resp 16   Ht 5\' 11"  (1.803 m)   Wt 65.8 kg   SpO2 100%   BMI 20.22 kg/m  CONSTITUTIONAL: Alert and oriented and responds appropriately to questions. Well-appearing; well-nourished HEAD: Normocephalic EYES: Conjunctivae clear, pupils appear equal, EOM appear intact ENT: normal nose; moist mucous membranes NECK: Supple, normal ROM CARD: RRR; S1 and S2 appreciated; no murmurs, no clicks, no rubs, no gallops RESP: Normal chest excursion without splinting or tachypnea; breath sounds clear and equal bilaterally; no wheezes, no rhonchi, no rales, no hypoxia or respiratory distress, speaking full sentences ABD/GI: Normal bowel sounds; non-distended; soft, non-tender, no rebound, no guarding, no peritoneal signs, no hepatosplenomegaly BACK:  The back appears normal EXT: Normal ROM in all joints; no deformity noted, no edema; no cyanosis SKIN: Normal color for age and race; warm; no rash on exposed skin NEURO: Moves all extremities equally PSYCH: The patient's mood and manner are appropriate.   MEDICAL DECISION MAKING: Patient here with complaints of abdominal pain.  Recent imaging suggested constipation.  He has had multiple recent CT scans and given his pain has been constant since the 16th I do not feel he needs repeat imaging today.  I suspect that he is still constipated and needs to increase his bowel regimen at home.  Will recheck labs to ensure no significant change.  Will give IV fluids, pain and nausea medicine.  Will be cautious with narcotics given concerns for constipation.  Unfortunately patient is allergic to Toradol and Bentyl.  Low suspicion that he is having an active Crohn's flare, bowel obstruction at this time.  His abdominal exam is benign.  ED PROGRESS: Patient's work-up today is unremarkable. No leukocytosis. Normal LFTs and lipase. No vomiting here in the emergency department. I suspect that this is constipation  especially given recent imaging while having the same symptoms. Have recommended he increase MiraLAX to 3 times daily and take Colace twice daily. I discussed with patient that I feel narcotic medications are not appropriate for treating constipation have recommended over-the-counter Tylenol. He is requested a second dose of morphine here which I discussed with patient I do not feel is appropriate. Will discharge with prescription for Zofran. I feel he is safe to be discharged home without repeat imaging today. He verbalized understanding and is comfortable with this plan.   At this time, I do not feel there is any life-threatening condition present. I have reviewed, interpreted and discussed all results (EKG, imaging, lab, urine as appropriate) and exam findings with patient/family. I have reviewed nursing notes and appropriate previous records.  I feel the patient is safe to be discharged home without further emergent workup and can continue workup as an outpatient as needed. Discussed usual and customary return precautions. Patient/family verbalize understanding and are comfortable with this plan.  Outpatient follow-up  has been provided as needed. All questions have been answered.   Raymond Huerta was evaluated in Emergency Department on 09/29/2020 for the symptoms described in the history of present illness. He was evaluated in the context of the global COVID-19 pandemic, which necessitated consideration that the patient might be at risk for infection with the SARS-CoV-2 virus that causes COVID-19. Institutional protocols and algorithms that pertain to the evaluation of patients at risk for COVID-19 are in a state of rapid change based on information released by regulatory bodies including the CDC and federal and state organizations. These policies and algorithms were followed during the patient's care in the ED.      Diara Chaudhari, Layla Maw, DO 09/30/20 0007

## 2020-09-29 NOTE — ED Triage Notes (Signed)
Pt c/o abd pain, rectal bleeding started yesterday-NAD-steady gait

## 2020-09-30 MED ORDER — ONDANSETRON 4 MG PO TBDP: 4.0000 mg | ORAL_TABLET | Freq: Four times a day (QID) | ORAL | 0 refills | Status: DC | PRN

## 2020-09-30 MED ORDER — ONDANSETRON HCL 4 MG/2ML IJ SOLN
4.0000 mg | Freq: Once | INTRAMUSCULAR | Status: AC
  Administered 2020-09-30: 4 mg via INTRAVENOUS
  Filled 2020-09-30: qty 2

## 2020-09-30 MED ORDER — DOCUSATE SODIUM 100 MG PO CAPS: 100.0000 mg | ORAL_CAPSULE | Freq: Two times a day (BID) | ORAL | 0 refills | Status: AC

## 2020-09-30 NOTE — Discharge Instructions (Addendum)
I recommend that you increase your water and fiber intake. If you are not able to eat foods high in fiber, you may use Benefiber or Metamucil over-the-counter. I also recommend you use MiraLAX 2-3 times a day and Colace 100 mg twice a day to help with bowel movements. These medications are over the counter.  You may use other over-the-counter medications such as Dulcolax, Fleet enemas, magnesium citrate as needed for constipation. Please note that some of these medications may cause you to have abdominal cramping which is normal. If you develop severe abdominal pain, fever (temperature of 100.4 or higher), persistent vomiting, distention of your abdomen, unable to have a bowel movement for 5 days or are not passing gas, please return to the hospital.  You may take Tylenol 1000 mg every 6 hours as needed for pain. Narcotic pain medications are not appropriate to treat constipation as they can make constipation worse.   Steps to find a Primary Care Provider (PCP):  Call [phone] or [phone] to access "[facility] Find a Doctor Service."  2.  You may also go on the [facility] website at [email or web address]  3.  [facility] also frequently accepts new patients.  [facility]  [address] [place] [address] [phone]  4.  There are also multiple [facility], [facility] and [facility] practices throughout the [place] that are frequently accepting new patients. You may find a clinic that is close to your home and contact them.  [facility] [email or web address] [phone]  [facility] [email or web address]  [facility] [email or web address] [phone]  [facility] [email or web address] [phone]  5.  Local Health Departments also can provide primary care services.  [facility]  [address] [place], [place] [address] [phone]  [facility] Department [address] [place] [address] [phone]  [facility] [address] [place] [address]  [place] [address] [phone]

## 2020-10-02 ENCOUNTER — Emergency Department (HOSPITAL_BASED_OUTPATIENT_CLINIC_OR_DEPARTMENT_OTHER): Payer: Self-pay

## 2020-10-02 ENCOUNTER — Other Ambulatory Visit: Payer: Self-pay

## 2020-10-02 ENCOUNTER — Emergency Department (HOSPITAL_BASED_OUTPATIENT_CLINIC_OR_DEPARTMENT_OTHER)
Admission: EM | Admit: 2020-10-02 | Discharge: 2020-10-02 | Disposition: A | Discharge status: Home / Self Care | Payer: Self-pay | Attending: Emergency Medicine | Admitting: Emergency Medicine

## 2020-10-02 ENCOUNTER — Encounter (HOSPITAL_BASED_OUTPATIENT_CLINIC_OR_DEPARTMENT_OTHER): Payer: Self-pay | Admitting: Emergency Medicine

## 2020-10-02 DIAGNOSIS — Z8719 Personal history of other diseases of the digestive system: Secondary | ICD-10-CM | POA: Insufficient documentation

## 2020-10-02 DIAGNOSIS — K5900 Constipation, unspecified: Secondary | ICD-10-CM | POA: Insufficient documentation

## 2020-10-02 LAB — COMPREHENSIVE METABOLIC PANEL
ALT: 16 U/L (ref 0–44)
AST: 22 U/L (ref 15–41)
Albumin: 4 g/dL (ref 3.5–5.0)
Alkaline Phosphatase: 62 U/L (ref 38–126)
Anion gap: 10 (ref 5–15)
BUN: 16 mg/dL (ref 6–20)
CO2: 26 mmol/L (ref 22–32)
Calcium: 8.3 mg/dL — ABNORMAL LOW (ref 8.9–10.3)
Chloride: 102 mmol/L (ref 98–111)
Creatinine, Ser: 0.95 mg/dL (ref 0.61–1.24)
GFR, Estimated: >60 mL/min (ref >60)
Glucose, Bld: 97 mg/dL (ref 70–99)
Potassium: 3.6 mmol/L (ref 3.5–5.1)
Sodium: 138 mmol/L (ref 135–145)
Total Bilirubin: 0.3 mg/dL (ref 0.3–1.2)
Total Protein: 6.8 g/dL (ref 6.5–8.1)

## 2020-10-02 LAB — CBC
HCT: 33.8 % — ABNORMAL LOW (ref 39.0–52.0)
Hemoglobin: 10.9 g/dL — ABNORMAL LOW (ref 13.0–17.0)
MCH: 26.8 pg (ref 26.0–34.0)
MCHC: 32.2 g/dL (ref 30.0–36.0)
MCV: 83.3 fL (ref 80.0–100.0)
Platelets: 307 10*3/uL (ref 150–400)
RBC: 4.06 MIL/uL — ABNORMAL LOW (ref 4.22–5.81)
RDW: 16.9 % — ABNORMAL HIGH (ref 11.5–15.5)
WBC: 6.5 10*3/uL (ref 4.0–10.5)
nRBC: 0 % (ref 0.0–0.2)

## 2020-10-02 MED ORDER — HYDROMORPHONE HCL 1 MG/ML IJ SOLN
1.0000 mg | Freq: Once | INTRAMUSCULAR | Status: AC
  Administered 2020-10-02: 1 mg via INTRAVENOUS
  Filled 2020-10-02: qty 1

## 2020-10-02 MED ORDER — HALOPERIDOL LACTATE 5 MG/ML IJ SOLN
2.0000 mg | Freq: Once | INTRAMUSCULAR | Status: DC
  Filled 2020-10-02: qty 1

## 2020-10-02 MED ORDER — ONDANSETRON HCL 4 MG/2ML IJ SOLN
4.0000 mg | Freq: Once | INTRAMUSCULAR | Status: AC
  Administered 2020-10-02: 4 mg via INTRAVENOUS
  Filled 2020-10-02: qty 2

## 2020-10-02 MED ORDER — GOLYTELY 236 G PO SOLR: 4000.0000 mL | Freq: Once | ORAL | 0 refills | Status: AC

## 2020-10-02 MED ORDER — IOHEXOL 300 MG/ML  SOLN
100.0000 mL | Freq: Once | INTRAMUSCULAR | Status: AC | PRN
  Administered 2020-10-02: 100 mL via INTRAVENOUS

## 2020-10-02 MED ORDER — FENTANYL CITRATE (PF) 100 MCG/2ML IJ SOLN
25.0000 ug | Freq: Once | INTRAMUSCULAR | Status: AC
  Administered 2020-10-02: 25 ug via INTRAVENOUS
  Filled 2020-10-02: qty 2

## 2020-10-02 NOTE — ED Notes (Signed)
Pt to CT

## 2020-10-02 NOTE — ED Triage Notes (Signed)
Pt reports hx crohns, states constipation x1 week, right lower quadrant pain and rectal bleeding. States seen in our ED for same.

## 2020-10-02 NOTE — ED Notes (Signed)
EDP at bedside  

## 2020-10-02 NOTE — Discharge Instructions (Addendum)
You were evaluated in the Emergency Department and after careful evaluation, we did not find any emergent condition requiring admission or further testing in the hospital.  Your exam/testing today was overall reassuring.  Symptoms seem to be due to constipation.  Please use the GoLYTELY medication to clear your bowels and follow-up closely with your GI doctor.  Please return to the Emergency Department if you experience any worsening of your condition.  Thank you for allowing [place] to be a part of your care.

## 2020-10-02 NOTE — ED Provider Notes (Signed)
[facility] Emergency Department Provider Note MRN:  [id number]  Arrival date & time: 10/02/20     Chief Complaint   Constipation and Abdominal Pain   History of Present Illness   Raymond Huerta is a 29 y.o. year-old male with a history of Crohn's disease presenting to the ED with chief complaint of abdominal pain.  Location: Right lower quadrant Duration: 2 to 3 weeks, worsening over the past few days Onset: Gradual Timing: Constant Description: Sharp Severity: Moderate to severe Exacerbating/Alleviating Factors: Worse with motion or palpation Associated Symptoms: No bowel movements for several days Pertinent Negatives: Denies fever, no chest pain or shortness of breath.  Nausea but no vomiting.   Review of Systems  A complete 10 system review of systems was obtained and all systems are negative except as noted in the HPI and PMH.   Patient's Health History    Past Medical History:  Diagnosis Date  . Crohn disease (HCC)   . Kienbock's disease     Past Surgical History:  Procedure Laterality Date  . ABDOMINAL SURGERY    . BIOPSY  09/15/2020   Procedure: BIOPSY;  Surgeon: Tressia Danas, MD;  Location: [facility];  Service: Gastroenterology;;  . COLONOSCOPY WITH PROPOFOL N/A 09/15/2020   Procedure: COLONOSCOPY WITH PROPOFOL;  Surgeon: Tressia Danas, MD;  Location: [facility];  Service: Gastroenterology;  Laterality: N/A;  . ESOPHAGOGASTRODUODENOSCOPY (EGD) WITH PROPOFOL N/A 09/15/2020   Procedure: ESOPHAGOGASTRODUODENOSCOPY (EGD) WITH PROPOFOL;  Surgeon: Tressia Danas, MD;  Location: [facility];  Service: Gastroenterology;  Laterality: N/A;  . WRIST SURGERY      History reviewed. No pertinent family history.  Social History   Socioeconomic History  . Marital status: Single    Spouse name: Not on file  . Number of children: Not on file  . Years of education: Not on file  . Highest education level: Not on file    Occupational History  . Not on file  Tobacco Use  . Smoking status: Never Smoker  . Smokeless tobacco: Never Used  Vaping Use  . Vaping Use: Never used  Substance and Sexual Activity  . Alcohol use: Never  . Drug use: Never  . Sexual activity: Not on file  Other Topics Concern  . Not on file  Social History Narrative  . Not on file   Social Determinants of Health   Financial Resource Strain:   . Difficulty of Paying Living Expenses: Not on file  Food Insecurity:   . Worried About Programme researcher, broadcasting/film/video in the Last Year: Not on file  . Ran Out of Food in the Last Year: Not on file  Transportation Needs:   . Lack of Transportation (Medical): Not on file  . Lack of Transportation (Non-Medical): Not on file  Physical Activity:   . Days of Exercise per Week: Not on file  . Minutes of Exercise per Session: Not on file  Stress:   . Feeling of Stress : Not on file  Social Connections:   . Frequency of Communication with Friends and Family: Not on file  . Frequency of Social Gatherings with Friends and Family: Not on file  . Attends Religious Services: Not on file  . Active Member of Clubs or Organizations: Not on file  . Attends Banker Meetings: Not on file  . Marital Status: Not on file  Intimate Partner Violence:   . Fear of Current or Ex-Partner: Not on file  . Emotionally Abused: Not on file  .  Physically Abused: Not on file  . Sexually Abused: Not on file     Physical Exam   Vitals:   10/02/20 0219 10/02/20 0339  BP: (!) 151/91 (!) 144/103  Pulse: 70 91  Resp: 18 16  Temp: 98 F (36.7 C)   SpO2: 100% 100%    CONSTITUTIONAL: Well-appearing, NAD NEURO:  Alert and oriented x 3, no focal deficits EYES:  eyes equal and reactive ENT/NECK:  no LAD, no JVD CARDIO: Regular rate, well-perfused, normal S1 and S2 PULM:  CTAB no wheezing or rhonchi GI/GU:  normal bowel sounds, non-distended, right lower quadrant tenderness with guarding MSK/SPINE:  No gross  deformities, no edema SKIN:  no rash, atraumatic PSYCH:  Appropriate speech and behavior  *Additional and/or pertinent findings included in MDM below  Diagnostic and Interventional Summary    EKG Interpretation  Date/Time:    Ventricular Rate:    PR Interval:    QRS Duration:   QT Interval:    QTC Calculation:   R Axis:     Text Interpretation:        Labs Reviewed  CBC - Abnormal; Notable for the following components:      Result Value   RBC 4.06 (*)    Hemoglobin 10.9 (*)    HCT 33.8 (*)    RDW 16.9 (*)    All other components within normal limits  COMPREHENSIVE METABOLIC PANEL - Abnormal; Notable for the following components:   Calcium 8.3 (*)    All other components within normal limits    CT ABDOMEN PELVIS W CONTRAST  Final Result      Medications  fentaNYL (SUBLIMAZE) injection 25 mcg (25 mcg Intravenous Given 10/02/20 0246)  ondansetron (ZOFRAN) injection 4 mg (4 mg Intravenous Given 10/02/20 0246)  iohexol (OMNIPAQUE) 300 MG/ML solution 100 mL (100 mLs Intravenous Contrast Given 10/02/20 0258)  HYDROmorphone (DILAUDID) injection 1 mg (1 mg Intravenous Given 10/02/20 2706)     Procedures  /  Critical Care Procedures  ED Course and Medical Decision Making  I have reviewed the triage vital signs, the nursing notes, and pertinent available records from the EMR.  Listed above are laboratory and imaging tests that I personally ordered, reviewed, and interpreted and then considered in my medical decision making (see below for details).  Crohn's disease with multiple ED visits recently, multiple CT abdomen evaluations recently.  Was admitted and started on mesalamine after completing a course of steroids.  Here because of constipation, worsening pain.  Patient convinced something is wrong and requests CT imaging.  We discussed the pros and cons of imaging especially given his multiple recent CT scans and the concern for radiation exposure.  Still patient wishes to  proceed with CT.  Disimpaction attempted, empty rectal vault.  Patient seems to have a downtrending hemoglobin over the past 2 weeks, will check again today.     Labs reassuring, H&H improving.  CT without acute process, does demonstrate constipation.  Appropriate for discharge with more aggressive constipation management strategy.  Elmer Sow. Pilar Plate, MD [facility] Emergency [facility] [email or web address]  Final Clinical Impressions(s) / ED Diagnoses     ICD-10-CM   1. Constipation, unspecified constipation type  K59.00     ED Discharge Orders         Ordered    polyethylene glycol (GOLYTELY) 236 g solution   Once        10/02/20 0352           Discharge  Instructions Discussed with and Provided to Patient:     Discharge Instructions     You were evaluated in the Emergency Department and after careful evaluation, we did not find any emergent condition requiring admission or further testing in the hospital.  Your exam/testing today was overall reassuring.  Symptoms seem to be due to constipation.  Please use the GoLYTELY medication to clear your bowels and follow-up closely with your GI doctor.  Please return to the Emergency Department if you experience any worsening of your condition.  Thank you for allowing [place] to be a part of your care.        Sabas Sous, MD 10/02/20 [phone]

## 2020-10-14 ENCOUNTER — Emergency Department (HOSPITAL_BASED_OUTPATIENT_CLINIC_OR_DEPARTMENT_OTHER)
Admission: EM | Admit: 2020-10-14 | Discharge: 2020-10-14 | Disposition: A | Discharge status: Home / Self Care | Payer: Self-pay | Attending: Emergency Medicine | Admitting: Emergency Medicine

## 2020-10-14 ENCOUNTER — Encounter (HOSPITAL_BASED_OUTPATIENT_CLINIC_OR_DEPARTMENT_OTHER): Payer: Self-pay | Admitting: Emergency Medicine

## 2020-10-14 ENCOUNTER — Emergency Department (HOSPITAL_BASED_OUTPATIENT_CLINIC_OR_DEPARTMENT_OTHER): Payer: Self-pay

## 2020-10-14 ENCOUNTER — Other Ambulatory Visit: Payer: Self-pay

## 2020-10-14 DIAGNOSIS — R1031 Right lower quadrant pain: Secondary | ICD-10-CM | POA: Insufficient documentation

## 2020-10-14 DIAGNOSIS — Z79899 Other long term (current) drug therapy: Secondary | ICD-10-CM | POA: Insufficient documentation

## 2020-10-14 DIAGNOSIS — R109 Unspecified abdominal pain: Secondary | ICD-10-CM

## 2020-10-14 DIAGNOSIS — I10 Essential (primary) hypertension: Secondary | ICD-10-CM | POA: Insufficient documentation

## 2020-10-14 LAB — CBC WITH DIFFERENTIAL/PLATELET
Abs Immature Granulocytes: 0.03 10*3/uL (ref 0.00–0.07)
Basophils Absolute: 0.1 10*3/uL (ref 0.0–0.1)
Basophils Relative: 2 %
Eosinophils Absolute: 0.2 10*3/uL (ref 0.0–0.5)
Eosinophils Relative: 4 %
HCT: 35.3 % — ABNORMAL LOW (ref 39.0–52.0)
Hemoglobin: 11.2 g/dL — ABNORMAL LOW (ref 13.0–17.0)
Immature Granulocytes: 1 %
Lymphocytes Relative: 20 %
Lymphs Abs: 1.1 10*3/uL (ref 0.7–4.0)
MCH: 26.2 pg (ref 26.0–34.0)
MCHC: 31.7 g/dL (ref 30.0–36.0)
MCV: 82.7 fL (ref 80.0–100.0)
Monocytes Absolute: 0.6 10*3/uL (ref 0.1–1.0)
Monocytes Relative: 12 %
Neutro Abs: 3.3 10*3/uL (ref 1.7–7.7)
Neutrophils Relative %: 61 %
Platelets: 337 10*3/uL (ref 150–400)
RBC: 4.27 MIL/uL (ref 4.22–5.81)
RDW: 15.9 % — ABNORMAL HIGH (ref 11.5–15.5)
WBC: 5.3 10*3/uL (ref 4.0–10.5)
nRBC: 0 % (ref 0.0–0.2)

## 2020-10-14 LAB — URINALYSIS, ROUTINE W REFLEX MICROSCOPIC
Bilirubin Urine: NEGATIVE
Glucose, UA: NEGATIVE mg/dL
Hgb urine dipstick: NEGATIVE
Ketones, ur: NEGATIVE mg/dL
Leukocytes,Ua: NEGATIVE
Nitrite: NEGATIVE
Protein, ur: NEGATIVE mg/dL
Specific Gravity, Urine: >1.03 — ABNORMAL HIGH (ref 1.005–1.030)
pH: 6.5 (ref 5.0–8.0)

## 2020-10-14 LAB — COMPREHENSIVE METABOLIC PANEL
ALT: 13 U/L (ref 0–44)
AST: 26 U/L (ref 15–41)
Albumin: 4.3 g/dL (ref 3.5–5.0)
Alkaline Phosphatase: 66 U/L (ref 38–126)
Anion gap: 10 (ref 5–15)
BUN: 13 mg/dL (ref 6–20)
CO2: 24 mmol/L (ref 22–32)
Calcium: 8.9 mg/dL (ref 8.9–10.3)
Chloride: 103 mmol/L (ref 98–111)
Creatinine, Ser: 0.94 mg/dL (ref 0.61–1.24)
GFR, Estimated: >60 mL/min (ref >60)
Glucose, Bld: 82 mg/dL (ref 70–99)
Potassium: 3.8 mmol/L (ref 3.5–5.1)
Sodium: 137 mmol/L (ref 135–145)
Total Bilirubin: 0.3 mg/dL (ref 0.3–1.2)
Total Protein: 7.3 g/dL (ref 6.5–8.1)

## 2020-10-14 MED ORDER — HYDROMORPHONE HCL 1 MG/ML IJ SOLN
1.0000 mg | Freq: Once | INTRAMUSCULAR | Status: AC
  Administered 2020-10-14: 1 mg via INTRAVENOUS
  Filled 2020-10-14: qty 1

## 2020-10-14 MED ORDER — ONDANSETRON HCL 4 MG/2ML IJ SOLN
4.0000 mg | Freq: Once | INTRAMUSCULAR | Status: AC
  Administered 2020-10-14: 4 mg via INTRAVENOUS
  Filled 2020-10-14: qty 2

## 2020-10-14 MED ORDER — IOHEXOL 300 MG/ML  SOLN
100.0000 mL | Freq: Once | INTRAMUSCULAR | Status: AC | PRN
  Administered 2020-10-14: 100 mL via INTRAVENOUS

## 2020-10-14 MED ORDER — SODIUM CHLORIDE 0.9 % IV BOLUS
1000.0000 mL | Freq: Once | INTRAVENOUS | Status: AC
  Administered 2020-10-14: 1000 mL via INTRAVENOUS

## 2020-10-14 NOTE — Discharge Instructions (Addendum)
Please schedule follow-up appointment with a primary care doctor as well as your gastroenterologist.  Return to ER if you develop worsening pain, vomiting, diarrhea, blood in stools, fevers or other new concerning symptom.

## 2020-10-14 NOTE — ED Provider Notes (Signed)
29 y/o male with Crohn's disease presents for worsening abdominal pain. Labs reassuring. CT ordered to eval for crohn's flare or other acute complications.   3:00 PM received sign out, f/u on CT  4:30 PM CT scan negative, vital signs remain stable, given reassuring work up, will dc home and stress need for f/u with gastroenterology.   Milagros Loll, MD 10/14/20 2001

## 2020-10-14 NOTE — ED Notes (Signed)
Pt given oral contrast to drink at 1330; scan time 1530  This CT will be pt's 4th CT in 6 weeks; EDMD made aware, chooses to proceed

## 2020-10-14 NOTE — ED Triage Notes (Signed)
Reports RLQ pain x 1 week.  Hx of crohns.  Reports pain is worse since last night.  Also c/o rectal bleeding for the last 3-4days.

## 2020-10-14 NOTE — ED Provider Notes (Signed)
[facility] EMERGENCY DEPARTMENT Provider Note   CSN: [id number] Arrival date & time: 10/14/20  1238     History Chief Complaint  Patient presents with  . Abdominal Pain    Raymond Huerta is a 29 y.o. male.  Patient presents with right lower quadrant pain describes it as sharp, severe.  Associated with intermittent bloody stools as per his history of Crohn's disease andhemicolectomy in the past with intermittent abdominal pains in the past.  However the patient states that the pain has been more severe than normal in the last 3 to 4 days.  He states he had an abscess in the past and is concerned that something else might be occurring now.  He used to have a GI doctor but is currently in between GI doctors at this time.  He had some leftover steroid at home which he took yesterday without improvement and presents to ER today.  Otherwise denies any fevers no cough no vomiting.        Past Medical History:  Diagnosis Date  . Crohn disease (HCC)   . 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 IBD (inflammatory bowel disease) 09/16/2020  . Crohn's disease with rectal bleeding (HCC)   . Nausea without vomiting   . Abdominal pain   . Folate deficiency 09/13/2020  . Low vitamin B12 level 09/13/2020  . Normocytic anemia 09/13/2020  . Anxiety and depression 09/13/2020  . Essential hypertension 09/13/2020  . Leukocytosis 09/13/2020  . Hyperglycemia 09/13/2020  . Acute Crohn's disease (HCC) 09/12/2020  . Right lower quadrant abdominal pain     Past Surgical History:  Procedure Laterality Date  . ABDOMINAL SURGERY    . BIOPSY  09/15/2020   Procedure: BIOPSY;  Surgeon: Tressia Danas, MD;  Location: [facility];  Service: Gastroenterology;;  . COLONOSCOPY WITH PROPOFOL N/A 09/15/2020   Procedure: COLONOSCOPY WITH PROPOFOL;  Surgeon: Tressia Danas, MD;  Location: [facility];  Service: Gastroenterology;  Laterality: N/A;   . ESOPHAGOGASTRODUODENOSCOPY (EGD) WITH PROPOFOL N/A 09/15/2020   Procedure: ESOPHAGOGASTRODUODENOSCOPY (EGD) WITH PROPOFOL;  Surgeon: Tressia Danas, MD;  Location: [facility];  Service: Gastroenterology;  Laterality: N/A;  . WRIST SURGERY         No family history on file.  Social History   Tobacco Use  . Smoking status: Never Smoker  . Smokeless tobacco: Never Used  Vaping Use  . Vaping Use: Never used  Substance Use Topics  . Alcohol use: Never  . Drug use: Never    Home Medications Prior to Admission medications   Medication Sig Start Date End Date Taking? Authorizing Provider  ADDERALL XR 15 MG 24 hr capsule Take 15 mg by mouth at bedtime.  07/11/20   [provider]  amphetamine-dextroamphetamine (ADDERALL) 5 MG tablet Take 1 tablet by mouth daily. 07/14/20   [provider]  dicyclomine (BENTYL) 10 MG capsule Take 2 capsules (20 mg total) by mouth 3 (three) times daily before meals. 09/16/20 03/15/21  Rodolph Bong, MD  docusate sodium (COLACE) 100 MG capsule Take 1 capsule (100 mg total) by mouth every 12 (twelve) hours. 09/30/20   Ward, Layla Maw, DO  escitalopram (LEXAPRO) 10 MG tablet Take 10 mg by mouth daily. 07/14/20   [provider]  folic acid (FOLVITE) 1 MG tablet Take 1 tablet (1 mg total) by mouth daily. 09/17/20   Rodolph Bong, MD  LORazepam (ATIVAN) 1 MG tablet Take 1 mg by  mouth daily as needed for anxiety.  06/04/20   [provider]  MARINOL 2.5 MG capsule Take 2.5 mg by mouth 2 (two) times daily as needed (appetite).  07/14/20   [provider]  mesalamine (LIALDA) 1.2 g EC tablet Take 4 tablets (4.8 g total) by mouth daily with breakfast. 09/16/20   Rodolph Bong, MD  metoCLOPramide (REGLAN) 10 MG tablet Take 1 tablet (10 mg total) by mouth every 6 (six) hours as needed for nausea. 09/07/20   Khatri, Hina, PA-C  metoprolol tartrate (LOPRESSOR) 25 MG tablet Take 1 tablet (25 mg total) by mouth 2  (two) times daily. 09/16/20   Rodolph Bong, MD  ondansetron (ZOFRAN ODT) 4 MG disintegrating tablet Take 1 tablet (4 mg total) by mouth every 6 (six) hours as needed for nausea or vomiting. 09/30/20   Ward, Layla Maw, DO  polyethylene glycol (MIRALAX) 17 g packet Take 17 g by mouth daily. Dissolve one cap full in solution (water, gatorade, etc.) and administer once cap-full daily. You may titrate up daily by 1 cap-full until the patient is having pudding consistency of stools. After the patient is able to start passing softer stools they will need to be on 1/2 cap-full daily for 2 weeks. 09/25/20   Couture, Cortni S, PA-C  promethazine (PHENERGAN) 25 MG suppository Place 1 suppository (25 mg total) rectally every 6 (six) hours as needed for nausea or vomiting. 04/23/20   Alvira Monday, MD  promethazine (PHENERGAN) 25 MG suppository Place 1 suppository (25 mg total) rectally every 6 (six) hours as needed for nausea or vomiting. 09/20/20   Tegeler, Canary Brim, MD  traZODone (DESYREL) 100 MG tablet Take 50 mg by mouth at bedtime.  07/14/20   [provider]  vitamin B-12 1000 MCG tablet Take 1 tablet (1,000 mcg total) by mouth daily. 09/17/20   Rodolph Bong, MD  VYVANSE 60 MG capsule Take 60 mg by mouth every morning. 01/29/20   [provider]    Allergies    Bentyl [dicyclomine], Toradol [ketorolac tromethamine], and Zithromax [azithromycin]  Review of Systems   Review of Systems  Constitutional: Negative for fever.  HENT: Negative for ear pain and sore throat.   Eyes: Negative for pain.  Respiratory: Negative for cough.   Cardiovascular: Negative for chest pain.  Gastrointestinal: Positive for abdominal pain.  Genitourinary: Negative for flank pain.  Musculoskeletal: Negative for back pain.  Skin: Negative for color change and rash.  Neurological: Negative for syncope.  All other systems reviewed and are negative.   Physical Exam Updated Vital Signs BP  (!) 139/92   Pulse 76   Temp 98.6 F (37 C) (Oral)   Resp 14   Ht 5\' 11"  (1.803 m)   Wt 68 kg   SpO2 100%   BMI 20.92 kg/m   Physical Exam Constitutional:      General: He is not in acute distress.    Appearance: He is well-developed.  HENT:     Head: Normocephalic.     Mouth/Throat:     Mouth: Mucous membranes are moist.  Cardiovascular:     Rate and Rhythm: Normal rate.  Pulmonary:     Effort: Pulmonary effort is normal.  Abdominal:     Palpations: Abdomen is soft.     Tenderness: There is abdominal tenderness in the right lower quadrant.  Musculoskeletal:     Right lower leg: No edema.     Left lower leg: No edema.  Skin:  General: Skin is warm.     Capillary Refill: Capillary refill takes less than 2 seconds.  Neurological:     General: No focal deficit present.     Mental Status: He is alert.     ED Results / Procedures / Treatments   Labs (all labs ordered are listed, but only abnormal results are displayed) Labs Reviewed  URINALYSIS, ROUTINE W REFLEX MICROSCOPIC - Abnormal; Notable for the following components:      Result Value   Specific Gravity, Urine >1.030 (*)    All other components within normal limits  CBC WITH DIFFERENTIAL/PLATELET - Abnormal; Notable for the following components:   Hemoglobin 11.2 (*)    HCT 35.3 (*)    RDW 15.9 (*)    All other components within normal limits  COMPREHENSIVE METABOLIC PANEL    EKG None  Radiology No results found.  Procedures Procedures (including critical care time)  Medications Ordered in ED Medications  sodium chloride 0.9 % bolus 1,000 mL (0 mLs Intravenous Stopped 10/14/20 1449)  HYDROmorphone (DILAUDID) injection 1 mg (1 mg Intravenous Given 10/14/20 1351)  ondansetron (ZOFRAN) injection 4 mg (4 mg Intravenous Given 10/14/20 1349)    ED Course  I have reviewed the triage vital signs and the nursing notes.  Pertinent labs & imaging results that were available during my care of the  patient were reviewed by me and considered in my medical decision making (see chart for details).    MDM Rules/Calculators/A&P                          Due to his history the patient has had multiple CT scans.  I discussed at length with the patient whether he felt this pain was more severe than normal after discussing the risks and benefits of continued CT imaging.  Patient states that he would not come to the ER this was more severe she is like it was more severe in the last few days.  His last CT scan was about a week ago which did not show any acute pathology.  Repeat imaging pursued labs otherwise unremarkable.  Patient will be signed out to the oncoming provider.   Final Clinical Impression(s) / ED Diagnoses Final diagnoses:  None    Rx / DC Orders ED Discharge Orders    None       Cheryll Cockayne, MD 10/14/20 1505

## 2020-12-06 ENCOUNTER — Encounter (HOSPITAL_BASED_OUTPATIENT_CLINIC_OR_DEPARTMENT_OTHER): Payer: Self-pay

## 2020-12-06 ENCOUNTER — Other Ambulatory Visit: Payer: Self-pay

## 2020-12-06 ENCOUNTER — Emergency Department (HOSPITAL_BASED_OUTPATIENT_CLINIC_OR_DEPARTMENT_OTHER)
Admission: EM | Admit: 2020-12-06 | Discharge: 2020-12-06 | Disposition: A | Discharge status: Home / Self Care | Payer: Self-pay | Attending: Emergency Medicine | Admitting: Emergency Medicine

## 2020-12-06 ENCOUNTER — Emergency Department (HOSPITAL_BASED_OUTPATIENT_CLINIC_OR_DEPARTMENT_OTHER): Payer: Self-pay

## 2020-12-06 DIAGNOSIS — I10 Essential (primary) hypertension: Secondary | ICD-10-CM | POA: Insufficient documentation

## 2020-12-06 DIAGNOSIS — R1031 Right lower quadrant pain: Secondary | ICD-10-CM

## 2020-12-06 DIAGNOSIS — Z79899 Other long term (current) drug therapy: Secondary | ICD-10-CM | POA: Insufficient documentation

## 2020-12-06 DIAGNOSIS — K50911 Crohn's disease, unspecified, with rectal bleeding: Secondary | ICD-10-CM | POA: Insufficient documentation

## 2020-12-06 DIAGNOSIS — K921 Melena: Secondary | ICD-10-CM

## 2020-12-06 LAB — OCCULT BLOOD X 1 CARD TO LAB, STOOL: Fecal Occult Bld: NEGATIVE

## 2020-12-06 LAB — SEDIMENTATION RATE: Sed Rate: 12 mm/hr (ref 0–16)

## 2020-12-06 LAB — CBC
HCT: 41.5 % (ref 39.0–52.0)
Hemoglobin: 13.1 g/dL (ref 13.0–17.0)
MCH: 24.8 pg — ABNORMAL LOW (ref 26.0–34.0)
MCHC: 31.6 g/dL (ref 30.0–36.0)
MCV: 78.4 fL — ABNORMAL LOW (ref 80.0–100.0)
Platelets: 276 10*3/uL (ref 150–400)
RBC: 5.29 MIL/uL (ref 4.22–5.81)
RDW: 15.4 % (ref 11.5–15.5)
WBC: 6.8 10*3/uL (ref 4.0–10.5)
nRBC: 0 % (ref 0.0–0.2)

## 2020-12-06 LAB — COMPREHENSIVE METABOLIC PANEL
ALT: 15 U/L (ref 0–44)
AST: 25 U/L (ref 15–41)
Albumin: 4.8 g/dL (ref 3.5–5.0)
Alkaline Phosphatase: 89 U/L (ref 38–126)
Anion gap: 12 (ref 5–15)
BUN: 14 mg/dL (ref 6–20)
CO2: 25 mmol/L (ref 22–32)
Calcium: 9 mg/dL (ref 8.9–10.3)
Chloride: 100 mmol/L (ref 98–111)
Creatinine, Ser: 1.1 mg/dL (ref 0.61–1.24)
GFR, Estimated: >60 mL/min (ref >60)
Glucose, Bld: 105 mg/dL — ABNORMAL HIGH (ref 70–99)
Potassium: 3.8 mmol/L (ref 3.5–5.1)
Sodium: 137 mmol/L (ref 135–145)
Total Bilirubin: 0.4 mg/dL (ref 0.3–1.2)
Total Protein: 7.9 g/dL (ref 6.5–8.1)

## 2020-12-06 MED ORDER — IOHEXOL 300 MG/ML  SOLN: 100.0000 mL | Freq: Once | INTRAMUSCULAR | Status: DC | PRN

## 2020-12-06 MED ORDER — ONDANSETRON 4 MG PO TBDP: 4.0000 mg | ORAL_TABLET | Freq: Three times a day (TID) | ORAL | 0 refills | Status: AC | PRN

## 2020-12-06 MED ORDER — ONDANSETRON HCL 4 MG/2ML IJ SOLN
4.0000 mg | Freq: Once | INTRAMUSCULAR | Status: AC
  Administered 2020-12-06: 4 mg via INTRAVENOUS
  Filled 2020-12-06: qty 2

## 2020-12-06 MED ORDER — IOHEXOL 9 MG/ML PO SOLN: 500.0000 mL | ORAL | Status: DC

## 2020-12-06 MED ORDER — SODIUM CHLORIDE 0.9 % IV BOLUS
1000.0000 mL | Freq: Once | INTRAVENOUS | Status: AC
  Administered 2020-12-06: 1000 mL via INTRAVENOUS

## 2020-12-06 NOTE — Discharge Instructions (Addendum)
Concern for complicated crohn's flare.  No imaging obtained, per your request. You will need to have close follow-up with your gastroenterologist.  I suggest that you call them first thing tomorrow AM.  Your labs were reassuring, but you would benefit from imaging.   I have prescribed you Zofran for your nausea.  Return to the ED or seek immediate medical attention should you experience any new or worsening symptoms.

## 2020-12-06 NOTE — ED Provider Notes (Signed)
[facility] EMERGENCY DEPARTMENT Provider Note   CSN: [id number] Arrival date & time: 12/06/20  1609     History Chief Complaint  Patient presents with  . Rectal Bleeding    Raymond Huerta is a 30 y.o. male with past medical history of Crohn's disease and hemicolectomy who was admitted to the hospital on 09/12/2020 for acute Crohn's flare and subsequently evaluated in the ER on 5 separate occasions for abdominal pain and rectal bleeding, most recently on 10/14/2020, who returns to the ED with complaints of rectal bleeding.  On my examination, patient reports that for the past few days, he has been having progressively worsening right lower quadrant and suprapubic abdominal pain.  He states that this is typically where his Crohn's flares present.  He states that he has been unable to tolerate any food or liquid by mouth.  He endorses nausea and emesis, nonbloody, whenever he attempts to eat.  He suspects that he is dehydrated.  He also reports having bloody stools.  He states that he was followed by a doctor with [facility] gastroenterology.  He is in the process of getting reestablished with somebody soon.  He used to be on Stelara, but is no longer on it.  He denies any fevers or chills, chest pain or shortness of breath, urinary symptoms, or other symptoms.  HPI     Past Medical History:  Diagnosis Date  . Crohn disease (HCC)   . 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 IBD (inflammatory bowel disease) 09/16/2020  . Crohn's disease with rectal bleeding (HCC)   . Nausea without vomiting   . Abdominal pain   . Folate deficiency 09/13/2020  . Low vitamin B12 level 09/13/2020  . Normocytic anemia 09/13/2020  . Anxiety and depression 09/13/2020  . Essential hypertension 09/13/2020  . Leukocytosis 09/13/2020  . Hyperglycemia 09/13/2020  . Acute Crohn's disease (HCC) 09/12/2020  . Right lower quadrant abdominal pain     Past Surgical  History:  Procedure Laterality Date  . ABDOMINAL SURGERY    . BIOPSY  09/15/2020   Procedure: BIOPSY;  Surgeon: Tressia Danas, MD;  Location: [facility];  Service: Gastroenterology;;  . COLONOSCOPY WITH PROPOFOL N/A 09/15/2020   Procedure: COLONOSCOPY WITH PROPOFOL;  Surgeon: Tressia Danas, MD;  Location: [facility];  Service: Gastroenterology;  Laterality: N/A;  . ESOPHAGOGASTRODUODENOSCOPY (EGD) WITH PROPOFOL N/A 09/15/2020   Procedure: ESOPHAGOGASTRODUODENOSCOPY (EGD) WITH PROPOFOL;  Surgeon: Tressia Danas, MD;  Location: [facility];  Service: Gastroenterology;  Laterality: N/A;  . WRIST SURGERY         No family history on file.  Social History   Tobacco Use  . Smoking status: Never Smoker  . Smokeless tobacco: Never Used  Vaping Use  . Vaping Use: Never used  Substance Use Topics  . Alcohol use: Never  . Drug use: Never    Home Medications Prior to Admission medications   Medication Sig Start Date End Date Taking? Authorizing Provider  ADDERALL XR 15 MG 24 hr capsule Take 15 mg by mouth at bedtime.  07/11/20   [provider]  amphetamine-dextroamphetamine (ADDERALL) 5 MG tablet Take 1 tablet by mouth daily. 07/14/20   [provider]  dicyclomine (BENTYL) 10 MG capsule Take 2 capsules (20 mg total) by mouth 3 (three) times daily before meals. 09/16/20 03/15/21  Rodolph Bong, MD  docusate sodium (COLACE) 100 MG capsule Take 1 capsule (100 mg total) by mouth every  12 (twelve) hours. 09/30/20   Ward, Layla Maw, DO  escitalopram (LEXAPRO) 10 MG tablet Take 10 mg by mouth daily. 07/14/20   [provider]  folic acid (FOLVITE) 1 MG tablet Take 1 tablet (1 mg total) by mouth daily. 09/17/20   Rodolph Bong, MD  LORazepam (ATIVAN) 1 MG tablet Take 1 mg by mouth daily as needed for anxiety.  06/04/20   [provider]  MARINOL 2.5 MG capsule Take 2.5 mg by mouth 2 (two) times daily as needed (appetite).  07/14/20   [provider]  mesalamine (LIALDA) 1.2 g EC tablet Take 4 tablets (4.8 g total) by mouth daily with breakfast. 09/16/20   Rodolph Bong, MD  metoCLOPramide (REGLAN) 10 MG tablet Take 1 tablet (10 mg total) by mouth every 6 (six) hours as needed for nausea. 09/07/20   Khatri, Hina, PA-C  metoprolol tartrate (LOPRESSOR) 25 MG tablet Take 1 tablet (25 mg total) by mouth 2 (two) times daily. 09/16/20   Rodolph Bong, MD  ondansetron (ZOFRAN ODT) 4 MG disintegrating tablet Take 1 tablet (4 mg total) by mouth every 6 (six) hours as needed for nausea or vomiting. 09/30/20   Ward, Layla Maw, DO  polyethylene glycol (MIRALAX) 17 g packet Take 17 g by mouth daily. Dissolve one cap full in solution (water, gatorade, etc.) and administer once cap-full daily. You may titrate up daily by 1 cap-full until the patient is having pudding consistency of stools. After the patient is able to start passing softer stools they will need to be on 1/2 cap-full daily for 2 weeks. 09/25/20   Couture, Cortni S, PA-C  promethazine (PHENERGAN) 25 MG suppository Place 1 suppository (25 mg total) rectally every 6 (six) hours as needed for nausea or vomiting. 04/23/20   Alvira Monday, MD  promethazine (PHENERGAN) 25 MG suppository Place 1 suppository (25 mg total) rectally every 6 (six) hours as needed for nausea or vomiting. 09/20/20   Tegeler, Canary Brim, MD  traZODone (DESYREL) 100 MG tablet Take 50 mg by mouth at bedtime.  07/14/20   [provider]  vitamin B-12 1000 MCG tablet Take 1 tablet (1,000 mcg total) by mouth daily. 09/17/20   Rodolph Bong, MD  VYVANSE 60 MG capsule Take 60 mg by mouth every morning. 01/29/20   [provider]    Allergies    Bentyl [dicyclomine], Toradol [ketorolac tromethamine], and Zithromax [azithromycin]  Review of Systems   Review of Systems  All other systems reviewed and are negative.   Physical Exam Updated Vital Signs BP (!) 137/94 (BP Location: Right  Arm)   Pulse 69   Temp 98.2 F (36.8 C) (Oral)   Resp 16   Ht 5\' 11"  (1.803 m)   Wt 65.3 kg   SpO2 100%   BMI 20.08 kg/m   Physical Exam Vitals and nursing note reviewed. Exam conducted with a chaperone present.  Constitutional:      General: He is not in acute distress.    Appearance: Normal appearance. He is not toxic-appearing.  HENT:     Head: Normocephalic and atraumatic.     Mouth/Throat:     Mouth: Mucous membranes are dry.  Eyes:     General: No scleral icterus.    Conjunctiva/sclera: Conjunctivae normal.  Cardiovascular:     Rate and Rhythm: Normal rate.     Pulses: Normal pulses.  Pulmonary:     Effort: Pulmonary effort is normal. No respiratory distress.  Abdominal:  General: Abdomen is flat. There is no distension.     Palpations: Abdomen is soft. There is no mass.     Tenderness: There is abdominal tenderness.     Hernia: No hernia is present.     Comments: Soft, nondistended.  TTP most notably in RLQ and suprapubic region.  No significant TTP elsewhere.  No overlying skin changes.  No masses or hernias noted.  Genitourinary:    Comments: Normal-appearing external anus.  No hemorrhoids or fissures noted.  Mild BRBPR noted on my exam. Musculoskeletal:        General: Normal range of motion.  Skin:    General: Skin is dry.  Neurological:     Mental Status: He is alert and oriented to person, place, and time.     GCS: GCS eye subscore is 4. GCS verbal subscore is 5. GCS motor subscore is 6.  Psychiatric:        Mood and Affect: Mood normal.        Behavior: Behavior normal.        Thought Content: Thought content normal.     ED Results / Procedures / Treatments   Labs (all labs ordered are listed, but only abnormal results are displayed) Labs Reviewed  COMPREHENSIVE METABOLIC PANEL - Abnormal; Notable for the following components:      Result Value   Glucose, Bld 105 (*)    All other components within normal limits  CBC - Abnormal; Notable for  the following components:   MCV 78.4 (*)    MCH 24.8 (*)    All other components within normal limits  OCCULT BLOOD X 1 CARD TO LAB, STOOL  SEDIMENTATION RATE  POC OCCULT BLOOD, ED    EKG None  Radiology No results found.  Procedures Procedures   Medications Ordered in ED Medications  iohexol (OMNIPAQUE) 9 MG/ML oral solution 500 mL (0 mLs Oral Hold 12/06/20 1933)  iohexol (OMNIPAQUE) 300 MG/ML solution 100 mL (has no administration in time range)  sodium chloride 0.9 % bolus 1,000 mL (1,000 mLs Intravenous New Bag/Given 12/06/20 1845)  ondansetron (ZOFRAN) injection 4 mg (4 mg Intravenous Given 12/06/20 1846)    ED Course  I have reviewed the triage vital signs and the nursing notes.  Pertinent labs & imaging results that were available during my care of the patient were reviewed by me and considered in my medical decision making (see chart for details).    MDM Rules/Calculators/A&P                          Kern Aho was evaluated in Emergency Department on 12/06/2020 for the symptoms described in the history of present illness. He was evaluated in the context of the global COVID-19 pandemic, which necessitated consideration that the patient might be at risk for infection with the SARS-CoV-2 virus that causes COVID-19. Institutional protocols and algorithms that pertain to the evaluation of patients at risk for COVID-19 are in a state of rapid change based on information released by regulatory bodies including the CDC and federal and state organizations. These policies and algorithms were followed during the patient's care in the ED.  I personally reviewed patient's medical chart and all notes from triage and staff during today's encounter. I have also ordered and reviewed all labs and imaging that I felt to be medically necessary in the evaluation of this patient's complaints and with consideration of their with their physical exam. If needed, translation services  were  available and utilized.   Patient's history and physical exam is consistent with acute Crohn's flare.  Will obtain CT abdomen and pelvis with contrast to evaluate for abscess, fistula, or other complication.  Also provide 1 L IV NS and Zofran given his nausea and inability to tolerate food or liquid by mouth.  I then received word from the nurse that he was declining the CT imaging.  On subsequent evaluation with patient, he states that he should have just stayed at home or called his gastroenterologist.  He states that he would be able to see his gastroenterologist in the next couple of days regarding his abdominal pain.  He states that he had been taking steroids at home including Solu-Medrol and prednisone, without relief.  I emphasized the importance of getting a CT for further evaluation for that very reason.  He continues to decline despite knowing risk for complication.  He states that he had an appendectomy and denies any recent fevers.  Initially patient states that he did not want any pain medication when offered Toradol or steroids, but now he is stating that he would like narcotic medications.  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  I informed him that if this is a Crohn's flare, he needs anti-inflammatory medications.  His story is changing and his behavior is peculiar.  He states that he has allergies to NSAIDs, Bentyl, and that steroids "are not working".  He states that they are regularly prescribed to him by his gastroenterologist.  Given his reassuring laboratory work-up and him declining CT imaging here in the ED, will honor his request and allow him to leave and instead follow-up with his gastroenterologist for ongoing evaluation and management.  Cautioned him on the risk of not proceeding with further imaging, but patient insists on simply going home.  Close ED return precautions discussed.  Discussed case with Dr. Renaye Rakers who agrees  with assessment and plan.  Final Clinical Impression(s) / ED Diagnoses Final diagnoses:  Hematochezia  Crohn's disease with rectal bleeding, unspecified gastrointestinal tract location [facility])  RLQ abdominal pain    Rx / DC Orders ED Discharge Orders    None       Lorelee New, PA-C 12/15/20 0910    Terald Sleeper, MD 12/15/20 [phone]

## 2020-12-06 NOTE — ED Notes (Signed)
Patient stated that he wants to go home and sleep his abdominal pain and hopefully, it will go away when he wakes up.

## 2020-12-06 NOTE — ED Notes (Addendum)
Upon entering the examination room to discharge the patient, this RN found the patient fully dressed and having self-removed his PIV. The patient stated he was leaving and also stated "that guy is a fucking asshole (referring to the assigned PA)". The patient continued on shouting and befre this RN could discharge this patient, the patient left. Pts visitor willingly accepted the discharge summary and was informed that if the patient needed to return to the ED, to please do so. No Discharge VS were able to be obtained.

## 2020-12-06 NOTE — ED Triage Notes (Signed)
Pt c/o rectal bleeding started last night-reports hx of Crohn's -NAD-steady gait

## 2020-12-06 NOTE — ED Notes (Signed)
ED Provider at bedside. 

## 2020-12-06 NOTE — ED Notes (Signed)
Chaperone for EDP for rectal exam and collection hemoccult collection

## 2020-12-06 NOTE — ED Notes (Signed)
PA made aware of patients request for pain medicine. Pt however is still apprehensive about being administered pain medicine and/or receiving a CT Scan.

## 2020-12-06 NOTE — ED Notes (Signed)
Patient stated to inform the EDP that he wants to skip the CT scan.

## 2020-12-30 IMAGING — CT CT ABD-PELV W/ CM
2 series · 14 of 27 positions shown, 19 images · IV contrast (Omnipaque)
Comparison: CT abdomen pelvis 10/02/2020

CLINICAL DATA: RLQ pain x 3 days with no improvement -- hx crohns
-- N/V with diarrhea

EXAM:
CT ABDOMEN AND PELVIS WITH CONTRAST
TECHNIQUE: Multidetector CT imaging of the abdomen and pelvis was performed
using the standard protocol following bolus administration of
intravenous contrast.
CONTRAST:  100mL OMNIPAQUE IOHEXOL 300 MG/ML  SOLN

[Series 4: lung bases · axial · 0.69mm/px · z∈[-128,-58]mm · 11 of 17 slices shown, 16 images]
[im 2/17  soft-tissue]
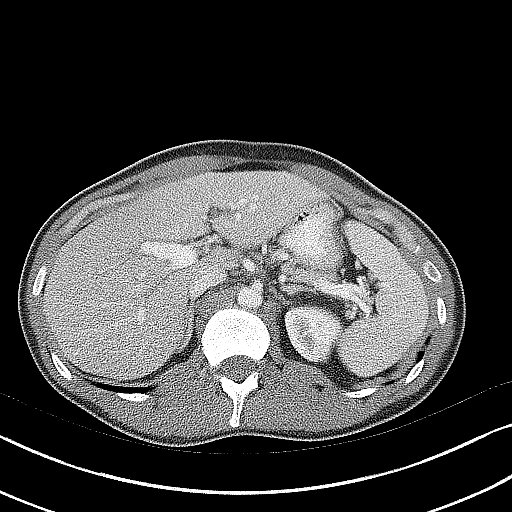
[im 2/17  bone]
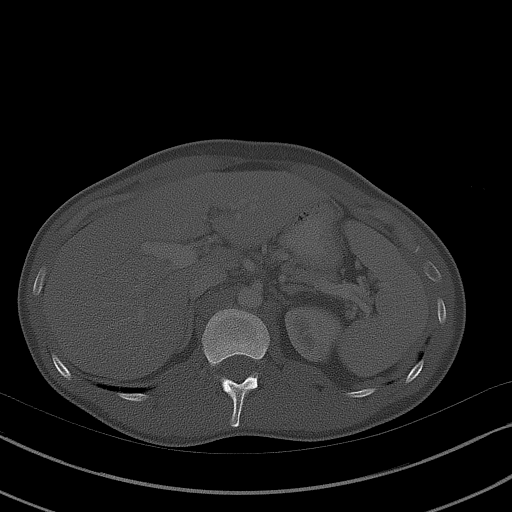
[im 4/17  soft-tissue]
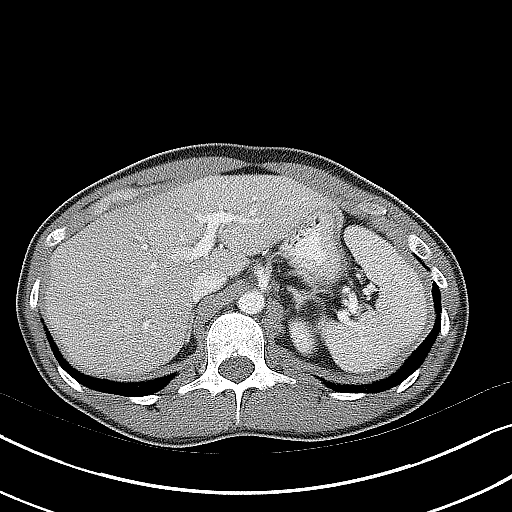
[im 5/17  soft-tissue]
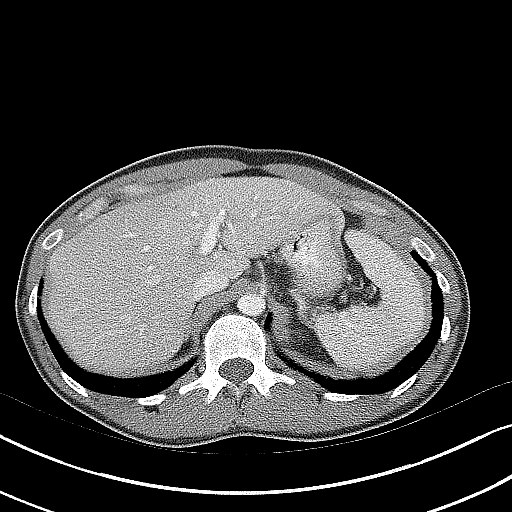
[im 7/17  soft-tissue]
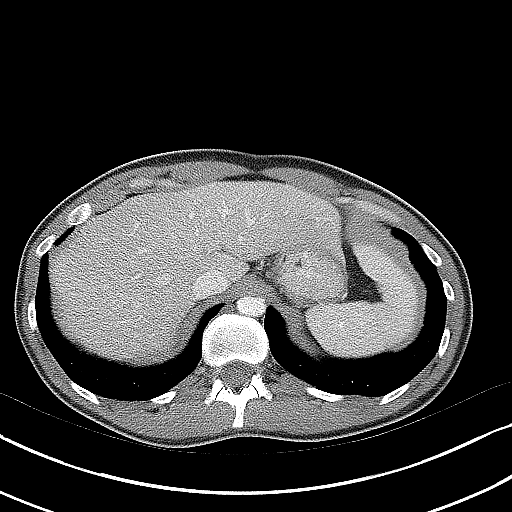
[im 8/17  soft-tissue]
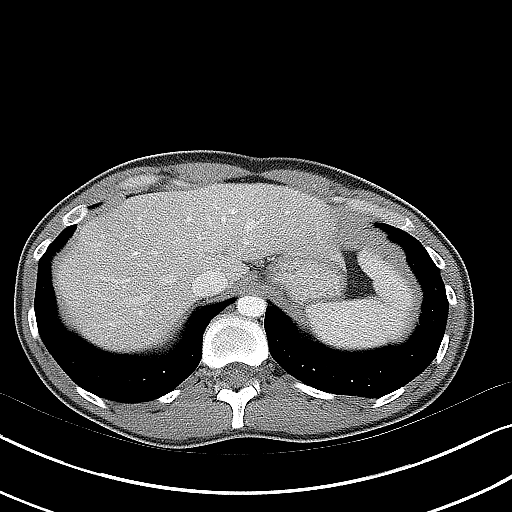
[im 10/17  soft-tissue]
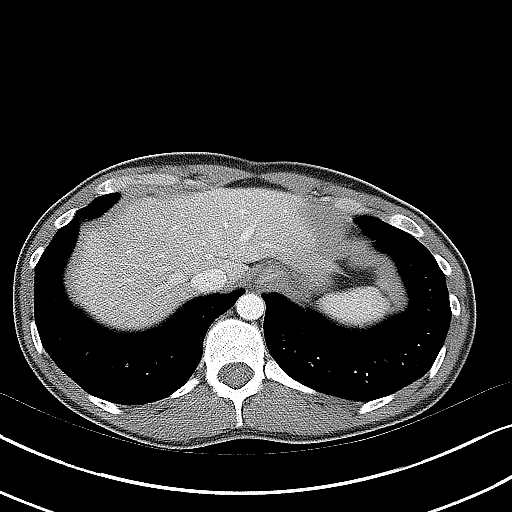
[im 11/17  soft-tissue]
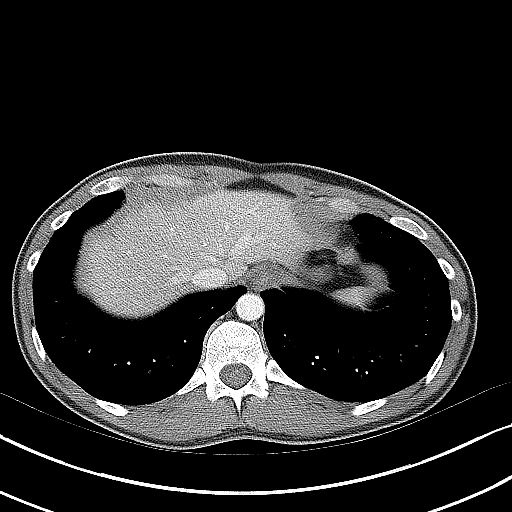
[im 13/17  soft-tissue]
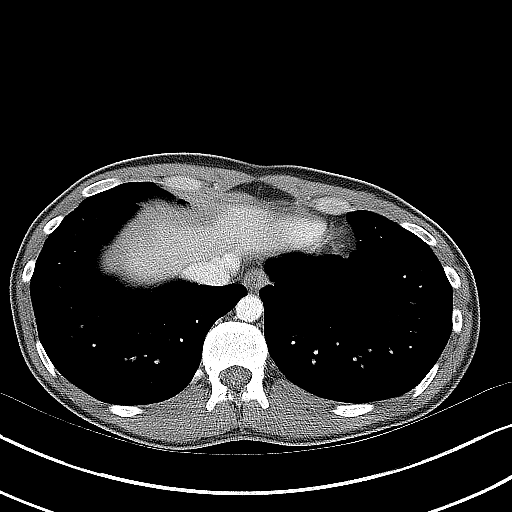
[im 13/17  lung]
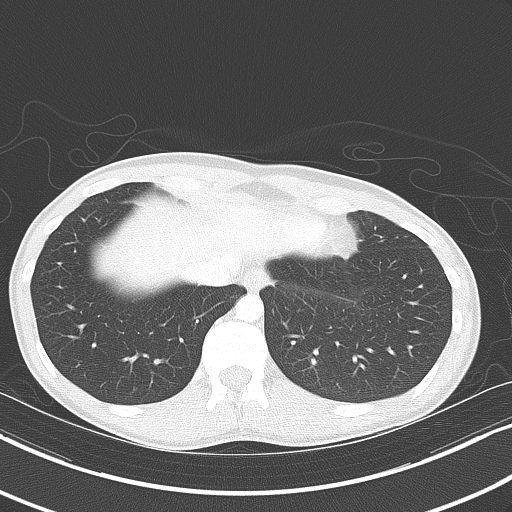
[im 14/17  soft-tissue]
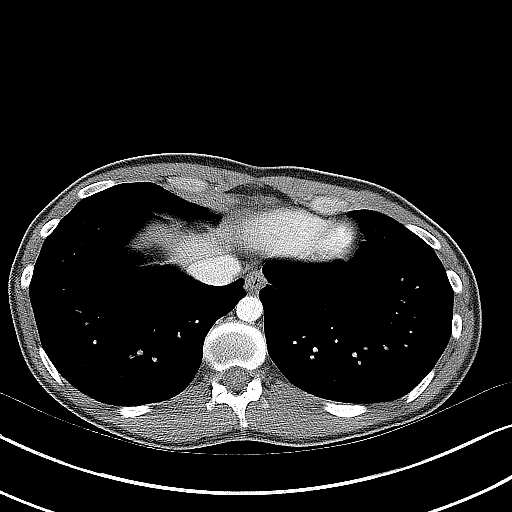
[im 14/17  lung]
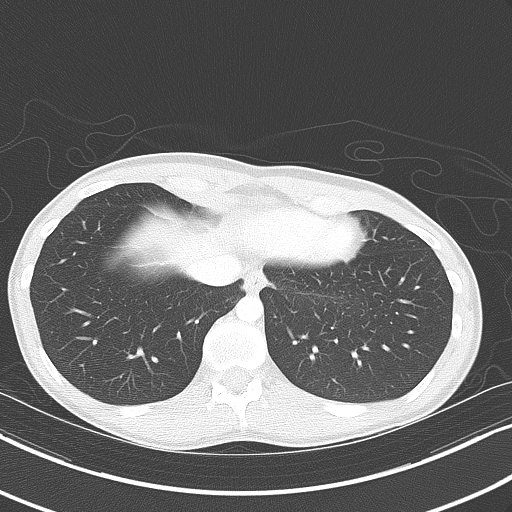
[im 14/17  bone]
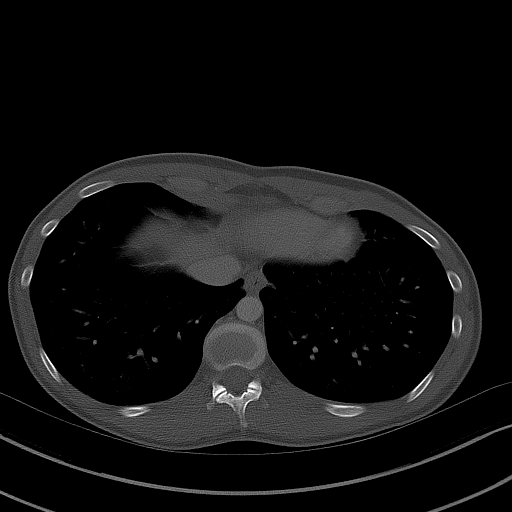
[im 15/17  lung]
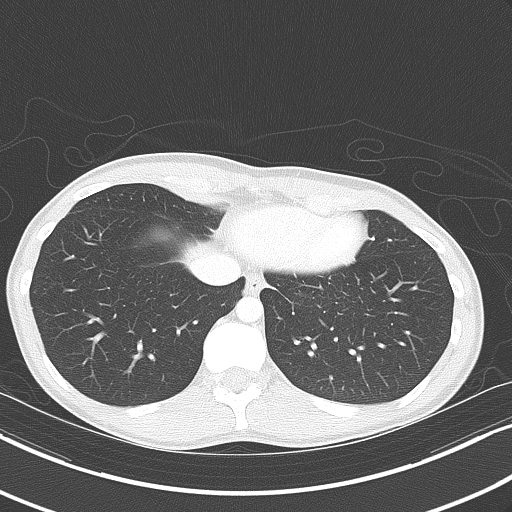
[im 16/17  soft-tissue]
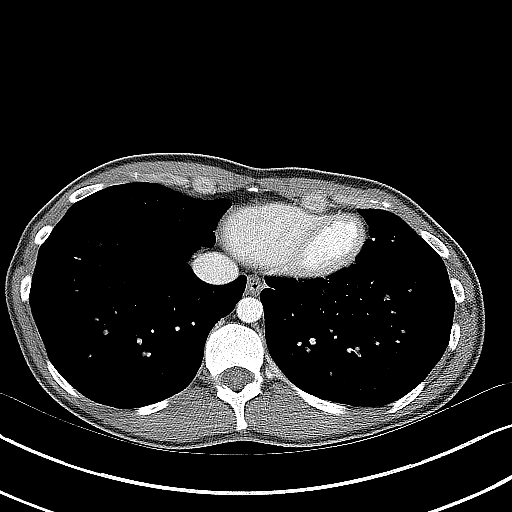
[im 16/17  lung]
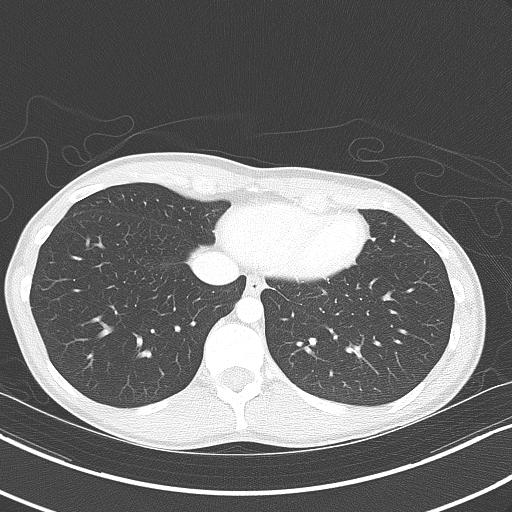

[Series 5: coronal st · coronal · 0.78mm/px · 3 of 72 slices shown]
[im 24/72  soft-tissue]
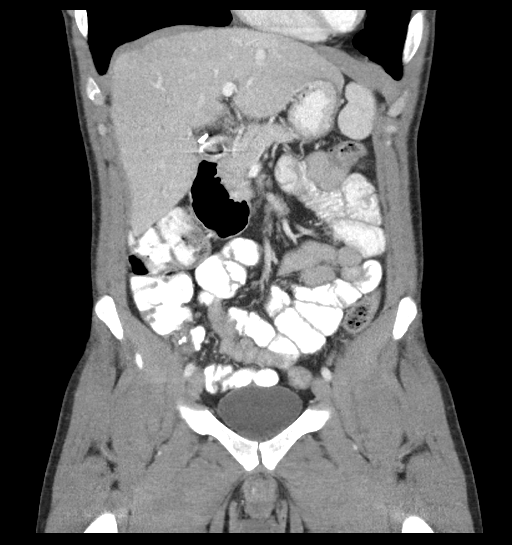
[im 32/72  soft-tissue]
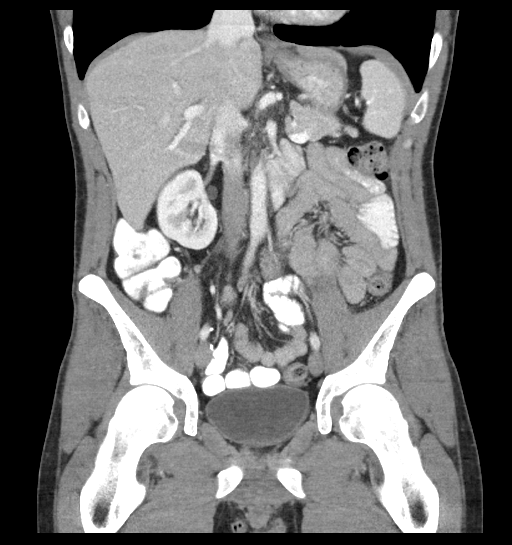
[im 40/72  soft-tissue]
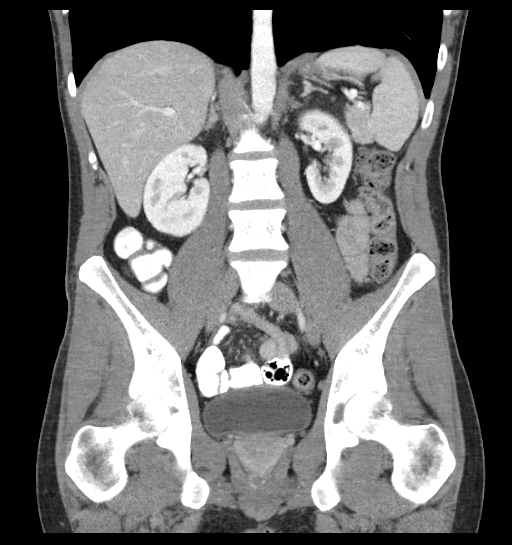

[14 of 27 positions shown; findings below may reference images not displayed]

FINDINGS: Lower chest: No acute abnormality.

Hepatobiliary: No focal liver abnormality. Status post
cholecystectomy. No biliary dilatation.

Pancreas: No focal lesion. Normal pancreatic contour. No surrounding
inflammatory changes. No main pancreatic ductal dilatation.

Spleen: Normal in size without focal abnormality.

Adrenals/Urinary Tract: No adrenal nodule bilaterally. Bilateral
kidneys enhance symmetrically. No hydronephrosis. No hydroureter. No
nephroureterolithiasis. The urinary bladder is unremarkable.

Stomach/Bowel: PO contrast reaches the distal transverse colon.
Stomach is within normal limits. No evidence of bowel wall
thickening or dilatation. No pneumatosis. The rectosigmoid colon is
under distended. The appendix is surgically absent.

Vascular/Lymphatic: No mesenteric vascular engorgement. No
significant vascular findings are present. Phleboliths noted within
the pelvis. The hepatic, portal, splenic, superior mesenteric veins
are patent. No enlarged abdominal or pelvic lymph nodes.

Reproductive: Prostate is unremarkable.

Other: No intraperitoneal free fluid. No intraperitoneal free gas.
No organized fluid collection.

Musculoskeletal:

No abdominal wall hernia or abnormality

No suspicious lytic or blastic osseous lesions. No acute displaced
fracture.
IMPRESSION: Limited evaluation of the rectosigmoid colon due to under distension
with no CT findings to suggest acute Crohn's disease. No acute
intra-abdominal or intrapelvic abnormality.
